# Patient Record
Sex: Female | Born: 1970
Health system: Southern US, Community
[De-identification: ages and names within clinical notes are randomized; demographics above are authoritative.]

## PROBLEM LIST (undated history)

## (undated) DIAGNOSIS — F32A Depression, unspecified: Secondary | ICD-10-CM

## (undated) DIAGNOSIS — F419 Anxiety disorder, unspecified: Secondary | ICD-10-CM

## (undated) DIAGNOSIS — K567 Ileus, unspecified: Secondary | ICD-10-CM

## (undated) DIAGNOSIS — R51 Headache: Secondary | ICD-10-CM

## (undated) DIAGNOSIS — F329 Major depressive disorder, single episode, unspecified: Secondary | ICD-10-CM

## (undated) DIAGNOSIS — C801 Malignant (primary) neoplasm, unspecified: Secondary | ICD-10-CM

## (undated) DIAGNOSIS — T8859XA Other complications of anesthesia, initial encounter: Secondary | ICD-10-CM

## (undated) DIAGNOSIS — Z9889 Other specified postprocedural states: Secondary | ICD-10-CM

## (undated) DIAGNOSIS — M1990 Unspecified osteoarthritis, unspecified site: Secondary | ICD-10-CM

## (undated) DIAGNOSIS — R519 Headache, unspecified: Secondary | ICD-10-CM

## (undated) DIAGNOSIS — T7840XA Allergy, unspecified, initial encounter: Secondary | ICD-10-CM

## (undated) DIAGNOSIS — R112 Nausea with vomiting, unspecified: Secondary | ICD-10-CM

## (undated) HISTORY — PX: ENDOMETRIAL ABLATION: SHX621

## (undated) HISTORY — DX: Headache, unspecified: R51.9

## (undated) HISTORY — DX: Depression, unspecified: F32.A

## (undated) HISTORY — DX: Anxiety disorder, unspecified: F41.9

## (undated) HISTORY — PX: WISDOM TOOTH EXTRACTION: SHX21

## (undated) HISTORY — PX: FOOT SURGERY: SHX648

## (undated) HISTORY — DX: Allergy, unspecified, initial encounter: T78.40XA

## (undated) HISTORY — DX: Major depressive disorder, single episode, unspecified: F32.9

## (undated) HISTORY — DX: Headache: R51

---

## 2005-07-25 ENCOUNTER — Ambulatory Visit: Payer: Self-pay | Admitting: Podiatry

## 2008-07-29 ENCOUNTER — Ambulatory Visit: Payer: Self-pay | Admitting: Obstetrics and Gynecology

## 2011-10-02 HISTORY — PX: TUBAL LIGATION: SHX77

## 2012-12-03 ENCOUNTER — Ambulatory Visit: Payer: Self-pay | Admitting: Obstetrics and Gynecology

## 2012-12-11 ENCOUNTER — Ambulatory Visit: Payer: Self-pay | Admitting: Obstetrics and Gynecology

## 2012-12-11 LAB — URINALYSIS, COMPLETE
Bilirubin,UR: NEGATIVE
Glucose,UR: NEGATIVE mg/dL (ref 0–75)
Leukocyte Esterase: NEGATIVE
Nitrite: NEGATIVE
Ph: 5 (ref 4.5–8.0)
Protein: NEGATIVE
Specific Gravity: 1.024 (ref 1.003–1.030)
Squamous Epithelial: 3
WBC UR: 1 /HPF (ref 0–5)

## 2012-12-11 LAB — HEMOGLOBIN: HGB: 15.1 g/dL (ref 12.0–16.0)

## 2012-12-19 ENCOUNTER — Ambulatory Visit: Payer: Self-pay | Admitting: Obstetrics and Gynecology

## 2013-08-03 LAB — HM PAP SMEAR: HM Pap smear: NORMAL

## 2014-03-17 ENCOUNTER — Ambulatory Visit: Payer: Self-pay | Admitting: Obstetrics and Gynecology

## 2015-01-14 ENCOUNTER — Other Ambulatory Visit: Payer: Self-pay | Admitting: Obstetrics and Gynecology

## 2015-01-14 DIAGNOSIS — Z1231 Encounter for screening mammogram for malignant neoplasm of breast: Secondary | ICD-10-CM

## 2015-01-21 NOTE — Op Note (Signed)
PATIENT NAME:  Tracey [name], BRIONES MR#:  [id number] DATE OF BIRTH:  21-Jan-1971  DATE OF PROCEDURE:  12/19/2012  PREOPERATIVE DIAGNOSES:  1.  Desires permanent sterilization. 2.  Heavy prolonged cycles.   POSTOPERATIVE DIAGNOSES:  1.  Desires permanent sterilization. 2.  Heavy prolonged cycles.   PROCEDURES PERFORMED:   1. NovaSure endometrial ablation with hysteroscopy. 2. Laparoscopic tubal cautery.   SURGEON: Ricky L. Amalia Hailey, MD  ANESTHESIA: General endotracheal.   FINDINGS: Grossly normal abdomen, uterus, tubes and ovaries. Normal endometrial cavity.  Cavity length was 5 cm, cavity width 3.7 cm, power throughout cycle was 102, and the duration of the cycle was 1 minute and 30 seconds.   ESTIMATED BLOOD LOSS:  Minimal.   COMPLICATIONS: None.  DRAINS:  In and out cath with a red rubber catheter prior to laparoscopy of approximately 100 mL.   PROCEDURE IN DETAIL: The patient was consented. Preoperative antibiotics given. Taken to the operating room and placed in the supine position where anesthesia was initiated, placed in the dorsal lithotomy position using candy-cane stirrups, prepped and draped in the usual sterile fashion. The cervix was visualized and grasped with a single tooth tenaculum. Easily dilated up to a 17-French with hysteroscopic findings showing unremarkable cavity.   The hysteroscope was removed and NovaSure device was placed with measurements as noted above. Cavity test passed on the first attempt and cycle was carried out over 30 seconds without difficulty. This instrument was removed. A single-tooth tenaculum was replaced with a Hulka tenaculum and we turned our attention to the abdomen.   An 11 port was placed infraumbilically, after a 1 cm vertical infraumbilical incision was made, with a 15 blade.   Pneumoperitoneum was established with findings as noted above. The patient was placed in Trendelenburg position and proceeded with cautery over an approximately  2.5 cm portion, in the mid ampullary region of bilateral fallopian tubes. Pressure was lowered to 6 mmHg. Areas were seen to be hemostatic. Pneumoperitoneum was allowed to resolve. Incisions were closed with deep of zero, sub-Q with 3-0 Vicryl, and Band-Aid was placed.  The patient tolerated the procedure well. I anticipate a routine postoperative course.  ____________________________ Rockey Situ. Amalia Hailey, MD rle:sb D: 12/19/2012 09:34:43 ET T: 12/19/2012 09:49:47 ET JOB#: [id number]  cc: Audry Pili L. Amalia Hailey, MD, <Dictator> Selmer Dominion MD ELECTRONICALLY SIGNED 12/20/2012 13:28

## 2015-03-16 LAB — HM MAMMOGRAPHY: HM Mammogram: NEGATIVE

## 2015-03-23 ENCOUNTER — Ambulatory Visit
Admission: RE | Admit: 2015-03-23 | Discharge: 2015-03-23 | Disposition: A | Discharge status: Home / Self Care | Payer: BLUE CROSS/BLUE SHIELD | Source: Ambulatory Visit | Attending: Obstetrics and Gynecology | Admitting: Obstetrics and Gynecology

## 2015-03-23 DIAGNOSIS — Z1231 Encounter for screening mammogram for malignant neoplasm of breast: Secondary | ICD-10-CM | POA: Diagnosis present

## 2015-03-23 HISTORY — DX: Malignant (primary) neoplasm, unspecified: C80.1

## 2015-08-16 ENCOUNTER — Encounter: Payer: Self-pay | Admitting: Nurse Practitioner

## 2015-08-16 ENCOUNTER — Ambulatory Visit (INDEPENDENT_AMBULATORY_CARE_PROVIDER_SITE_OTHER): Payer: BLUE CROSS/BLUE SHIELD | Admitting: Nurse Practitioner

## 2015-08-16 VITALS — BP 136/84 | HR 85 | Temp 97.8°F | Resp 12 | Ht 62.0 in | Wt 130.6 lb

## 2015-08-16 DIAGNOSIS — F5105 Insomnia due to other mental disorder: Secondary | ICD-10-CM

## 2015-08-16 DIAGNOSIS — R51 Headache: Secondary | ICD-10-CM | POA: Diagnosis not present

## 2015-08-16 DIAGNOSIS — Z9889 Other specified postprocedural states: Secondary | ICD-10-CM

## 2015-08-16 DIAGNOSIS — F341 Dysthymic disorder: Secondary | ICD-10-CM

## 2015-08-16 DIAGNOSIS — F418 Other specified anxiety disorders: Secondary | ICD-10-CM

## 2015-08-16 DIAGNOSIS — R519 Headache, unspecified: Secondary | ICD-10-CM

## 2015-08-16 DIAGNOSIS — Z7189 Other specified counseling: Secondary | ICD-10-CM

## 2015-08-16 DIAGNOSIS — Z7689 Persons encountering health services in other specified circumstances: Secondary | ICD-10-CM

## 2015-08-16 MED ORDER — ZOLPIDEM TARTRATE ER 12.5 MG PO TBCR: 12.5000 mg | EXTENDED_RELEASE_TABLET | Freq: Every evening | ORAL | Status: DC | PRN

## 2015-08-16 MED ORDER — BUSPIRONE HCL 10 MG PO TABS: 10.0000 mg | ORAL_TABLET | Freq: Two times a day (BID) | ORAL | Status: DC

## 2015-08-16 NOTE — Patient Instructions (Signed)
  Try the buspirone 1 tablet at night then after 7 days take 1 in the morning also.    Generalized Anxiety Disorder Generalized anxiety disorder (GAD) is a mental disorder. It interferes with life functions, including relationships, work, and school. GAD is different from normal anxiety, which everyone experiences at some point in their lives in response to specific life events and activities. Normal anxiety actually helps [place] prepare for and get through these life events and activities. Normal anxiety goes away after the event or activity is over.  GAD causes anxiety that is not necessarily related to specific events or activities. It also causes excess anxiety in proportion to specific events or activities. The anxiety associated with GAD is also difficult to control. GAD can vary from mild to severe. People with severe GAD can have intense waves of anxiety with physical symptoms (panic attacks).  SYMPTOMS The anxiety and worry associated with GAD are difficult to control. This anxiety and worry are related to many life events and activities and also occur more days than not for 6 months or longer. People with GAD also have three or more of the following symptoms (one or more in children):  Restlessness.   Fatigue.  Difficulty concentrating.   Irritability.  Muscle tension.  Difficulty sleeping or unsatisfying sleep. DIAGNOSIS GAD is diagnosed through an assessment by your health care provider. Your health care provider will ask you questions aboutyour mood,physical symptoms, and events in your life. Your health care provider may ask you about your medical history and use of alcohol or drugs, including prescription medicines. Your health care provider may also do a physical exam and blood tests. Certain medical conditions and the use of certain substances can cause symptoms similar to those associated with GAD. Your health care provider may refer you to a mental health specialist for  further evaluation. TREATMENT The following therapies are usually used to treat GAD:   Medication. Antidepressant medication usually is prescribed for long-term daily control. Antianxiety medicines may be added in severe cases, especially when panic attacks occur.   Talk therapy (psychotherapy). Certain types of talk therapy can be helpful in treating GAD by providing support, education, and guidance. A form of talk therapy called cognitive behavioral therapy can teach you healthy ways to think about and react to daily life events and activities.  Stress managementtechniques. These include yoga, meditation, and exercise and can be very helpful when they are practiced regularly. A mental health specialist can help determine which treatment is best for you. Some people see improvement with one therapy. However, other people require a combination of therapies.   This information is not intended to replace advice given to you by your health care provider. Make sure you discuss any questions you have with your health care provider.   Document Released: 01/12/2013 Document Revised: 10/08/2014 Document Reviewed: 01/12/2013 Elsevier Interactive Patient Education [employer].

## 2015-08-16 NOTE — Progress Notes (Signed)
Patient ID: TAQUILLA ECHEVERRY, female    DOB: 1971/01/24  Age: 44 y.o. MRN: [id number]  CC: Establish Care   HPI Tracey [name] presents for establishing care and CC of anxiety, insomnia issues.   1) New pt info:  Immunizations- Need records  Mammogram- 03/2015 normal per pt  Pap- 2014 normal per pt  Eye Exam- 2016  LMP- ablation  2) Chronic Problems-  Frequent headaches- worse prior to ablation 1-2 a month, using OTCs.  3) Acute Problems-  Anxiety- Pt referred by Dr. Nicolasa [name] by her CNM    Patient feels very anxious in social situations and everyday situations such as visiting the grocery store. She reports this has worsened over the past few months since she has not been sleeping as well.    Insomnia- Pt reports being on Ambien "for years", been out for a few months   Intermittent 12.5 mg use   Holy Tracey [name]- is trying currently   History Tracey [name] has a past medical history of Cancer ([place]) (skin); Frequent headaches; and Allergy.   She has past surgical history that includes Tubal ligation (2013).   Her family history includes Alcohol abuse in her brother; Breast cancer in her paternal grandmother; Diabetes in her paternal grandmother.She reports that she has never smoked. She has never used smokeless tobacco. She reports that she drinks about 1.2 oz of alcohol per week. She reports that she does not use illicit drugs.  No outpatient prescriptions prior to visit.   No facility-administered medications prior to visit.    ROS Review of Systems  Constitutional: Negative for fever, chills, diaphoresis and fatigue.  Respiratory: Negative for chest tightness, shortness of breath and wheezing.   Cardiovascular: Negative for chest pain, palpitations and leg swelling.  Gastrointestinal: Negative for nausea, vomiting and diarrhea.  Skin: Negative for rash.  Neurological: Positive for headaches. Negative for dizziness, weakness and numbness.  Psychiatric/Behavioral: Positive for  sleep disturbance. Negative for suicidal ideas. The patient is nervous/anxious.     Objective:  BP 136/84 mmHg  Pulse 85  Temp(Src) 97.8 F (36.6 C)  Resp 12  Ht 5\' 2"  (1.575 m)  Wt 130 lb 9.6 oz (59.24 kg)  BMI 23.88 kg/m2  SpO2 98%  Physical Exam  Constitutional: She is oriented to person, place, and time. She appears well-developed and well-nourished. No distress.  HENT:  Head: Normocephalic and atraumatic.  Right Ear: External ear normal.  Left Ear: External ear normal.  Eyes: Right eye exhibits no discharge. Left eye exhibits no discharge. No scleral icterus.  Cardiovascular: Normal rate, regular rhythm and normal heart sounds.  Exam reveals no gallop and no friction rub.   No murmur heard. Pulmonary/Chest: Effort normal and breath sounds normal. No respiratory distress. She has no wheezes. She has no rales. She exhibits no tenderness.  Neurological: She is alert and oriented to person, place, and time. No cranial nerve deficit. She exhibits normal muscle tone. Coordination normal.  Skin: Skin is warm and dry. No rash noted. She is not diaphoretic.  Psychiatric: She has a normal mood and affect. Her behavior is normal. Judgment and thought content normal.  Pt appears anxious when discussing her concerns today      Assessment & Plan:   Tracey [name] was seen today for establish care.  Diagnoses and all orders for this visit:  Encounter to establish care  Frequent headaches  History of endometrial ablation  Insomnia secondary to depression with anxiety  Other orders -     zolpidem (AMBIEN  CR) 12.5 MG CR tablet; Take 1 tablet (12.5 mg total) by mouth at bedtime as needed for sleep. -     busPIRone (BUSPAR) 10 MG tablet; Take 1 tablet (10 mg total) by mouth 2 (two) times daily.  I am having Tracey [name] start on zolpidem and busPIRone. I am also having her maintain her montelukast, levocetirizine, and hydrOXYzine.  Meds ordered this encounter  Medications  .  montelukast (SINGULAIR) 10 MG tablet    Sig: Take 1 tablet by mouth daily.    Refill:  0  . levocetirizine (XYZAL) 5 MG tablet    Sig: Take 1 tablet by mouth daily.    Refill:  0  . hydrOXYzine (ATARAX/VISTARIL) 25 MG tablet    Sig: Take 1 tablet by mouth daily.    Refill:  0  . zolpidem (AMBIEN CR) 12.5 MG CR tablet    Sig: Take 1 tablet (12.5 mg total) by mouth at bedtime as needed for sleep.    Dispense:  30 tablet    Refill:  2    Order Specific Question:  Supervising Provider    Answer:  Tracey [name] [[id number]]  . busPIRone (BUSPAR) 10 MG tablet    Sig: Take 1 tablet (10 mg total) by mouth 2 (two) times daily.    Dispense:  60 tablet    Refill:  0    Order Specific Question:  Supervising Provider    Answer:  Tracey [name] [[id number]]     Follow-up: Return in about 4 weeks (around 09/13/2015) for Anxiety follow up .

## 2015-08-16 NOTE — Progress Notes (Signed)
Pre visit review using our clinic review tool, if applicable. No additional management support is needed unless otherwise documented below in the visit note. 

## 2015-08-28 DIAGNOSIS — R51 Headache: Secondary | ICD-10-CM

## 2015-08-28 DIAGNOSIS — G4700 Insomnia, unspecified: Secondary | ICD-10-CM | POA: Insufficient documentation

## 2015-08-28 DIAGNOSIS — F5105 Insomnia due to other mental disorder: Secondary | ICD-10-CM

## 2015-08-28 DIAGNOSIS — F418 Other specified anxiety disorders: Secondary | ICD-10-CM | POA: Insufficient documentation

## 2015-08-28 DIAGNOSIS — Z7689 Persons encountering health services in other specified circumstances: Secondary | ICD-10-CM | POA: Insufficient documentation

## 2015-08-28 DIAGNOSIS — R519 Headache, unspecified: Secondary | ICD-10-CM | POA: Insufficient documentation

## 2015-08-28 DIAGNOSIS — Z9889 Other specified postprocedural states: Secondary | ICD-10-CM | POA: Insufficient documentation

## 2015-08-28 NOTE — Assessment & Plan Note (Signed)
Discussed acute and chronic issues. Reviewed health maintenance measures, PFSHx, and immunizations. Obtain records from previous facility.   

## 2015-08-28 NOTE — Assessment & Plan Note (Signed)
Patient has GYN care at a [facility] facility. Normal paps she reports and h/o ablation

## 2015-08-28 NOTE — Assessment & Plan Note (Addendum)
Will return to Ambien CR 12.5 mg. Pt was given CSC to sign. Will obtain UDS in future. [place] checked prior to prescribing. Buspar given for anxiety. Pt reports taking hydroxyzine for allergies and reports it does not cause fatigue for her. Pt declined counseling at this time. FU in 4 weeks.

## 2015-08-28 NOTE — Assessment & Plan Note (Signed)
Pt reports 1-2 HA a month currently. They have improved slightly. No known triggers. Continue OTC measures.

## 2015-09-13 ENCOUNTER — Ambulatory Visit: Payer: BLUE CROSS/BLUE SHIELD | Admitting: Nurse Practitioner

## 2015-11-14 ENCOUNTER — Telehealth: Payer: Self-pay | Admitting: *Deleted

## 2015-11-14 ENCOUNTER — Other Ambulatory Visit: Payer: Self-pay | Admitting: *Deleted

## 2015-11-14 MED ORDER — ZOLPIDEM TARTRATE ER 12.5 MG PO TBCR: 12.5000 mg | EXTENDED_RELEASE_TABLET | Freq: Every evening | ORAL | Status: DC | PRN

## 2015-11-14 NOTE — Telephone Encounter (Signed)
Rx faxed to pharmacy  

## 2015-11-14 NOTE — Telephone Encounter (Signed)
Pt requesting refill on Ambien, Last OV and refill 08/2015... Okay to refill?

## 2015-11-14 NOTE — Telephone Encounter (Signed)
Okay to fill for 1 month, but needs to be seen before another refill.

## 2015-12-19 ENCOUNTER — Ambulatory Visit (INDEPENDENT_AMBULATORY_CARE_PROVIDER_SITE_OTHER): Payer: BLUE CROSS/BLUE SHIELD | Admitting: Nurse Practitioner

## 2015-12-19 ENCOUNTER — Other Ambulatory Visit: Payer: Self-pay | Admitting: Obstetrics and Gynecology

## 2015-12-19 ENCOUNTER — Encounter: Payer: Self-pay | Admitting: Nurse Practitioner

## 2015-12-19 VITALS — BP 102/60 | HR 107 | Temp 98.4°F | Resp 14 | Ht 62.0 in | Wt 137.8 lb

## 2015-12-19 DIAGNOSIS — F341 Dysthymic disorder: Secondary | ICD-10-CM

## 2015-12-19 DIAGNOSIS — Z1231 Encounter for screening mammogram for malignant neoplasm of breast: Secondary | ICD-10-CM

## 2015-12-19 DIAGNOSIS — F5105 Insomnia due to other mental disorder: Secondary | ICD-10-CM

## 2015-12-19 DIAGNOSIS — F418 Other specified anxiety disorders: Secondary | ICD-10-CM

## 2015-12-19 MED ORDER — BUSPIRONE HCL 10 MG PO TABS: 10.0000 mg | ORAL_TABLET | Freq: Two times a day (BID) | ORAL | Status: DC

## 2015-12-19 NOTE — Progress Notes (Signed)
Patient ID: Tracey [name], female    DOB: 1970-11-04  Age: 45 y.o. MRN: [id number]  CC: Follow-up   HPI RENAYE MELLIES presents for follow up of ambien.   1) Eating at night- found a lot of wrappers in bed, but didn't remember anything happening  Groggy during the day with the Ambien   Wt Readings from Last 3 Encounters:  12/19/15 137 lb 12.8 oz (62.506 kg)  08/16/15 130 lb 9.6 oz (59.24 kg)   2) Anxiety is improved   History Nadalyn has a past medical history of Cancer ([place]) (skin); Frequent headaches; and Allergy.   She has past surgical history that includes Tubal ligation (2013).   Her family history includes Alcohol abuse in her brother; Breast cancer in her paternal grandmother; Diabetes in her paternal grandmother.She reports that she has never smoked. She has never used smokeless tobacco. She reports that she drinks about 1.2 oz of alcohol per week. She reports that she does not use illicit drugs.  Outpatient Prescriptions Prior to Visit  Medication Sig Dispense Refill  . hydrOXYzine (ATARAX/VISTARIL) 25 MG tablet Take 1 tablet by mouth daily.  0  . levocetirizine (XYZAL) 5 MG tablet Take 1 tablet by mouth daily.  0  . montelukast (SINGULAIR) 10 MG tablet Take 1 tablet by mouth daily.  0  . busPIRone (BUSPAR) 10 MG tablet Take 1 tablet (10 mg total) by mouth 2 (two) times daily. 60 tablet 0  . zolpidem (AMBIEN CR) 12.5 MG CR tablet Take 1 tablet (12.5 mg total) by mouth at bedtime as needed for sleep. 30 tablet 0   No facility-administered medications prior to visit.    ROS Review of Systems  Constitutional: Negative for fever, chills, diaphoresis and fatigue.  Respiratory: Negative for chest tightness, shortness of breath and wheezing.   Cardiovascular: Negative for chest pain, palpitations and leg swelling.  Gastrointestinal: Negative for nausea, vomiting and diarrhea.  Skin: Negative for rash.  Neurological: Negative for dizziness, weakness, numbness  and headaches.  Psychiatric/Behavioral: Positive for sleep disturbance. The patient is not nervous/anxious.     Objective:  BP 102/60 mmHg  Pulse 107  Temp(Src) 98.4 F (36.9 C) (Oral)  Resp 14  Ht 5\' 2"  (1.575 m)  Wt 137 lb 12.8 oz (62.506 kg)  BMI 25.20 kg/m2  SpO2 96%  Physical Exam  Constitutional: She is oriented to person, place, and time. She appears well-developed and well-nourished. No distress.  HENT:  Head: Normocephalic and atraumatic.  Right Ear: External ear normal.  Left Ear: External ear normal.  Cardiovascular: Regular rhythm.   Anxious tachycardic  Pulmonary/Chest: Effort normal and breath sounds normal.  Neurological: She is alert and oriented to person, place, and time.  Skin: Skin is warm and dry. She is not diaphoretic.  Psychiatric: She has a normal mood and affect. Her behavior is normal. Judgment and thought content normal.      Assessment & Plan:   Tamy was seen today for follow-up.  Diagnoses and all orders for this visit:  Insomnia secondary to depression with anxiety  Other orders -     busPIRone (BUSPAR) 10 MG tablet; Take 1 tablet (10 mg total) by mouth 2 (two) times daily.  I have discontinued [name]'s zolpidem. I am also having her maintain her montelukast, levocetirizine, hydrOXYzine, EPINEPHrine, PROAIR RESPICLICK, and busPIRone.  Meds ordered this encounter  Medications  . EPINEPHrine (EPIPEN 2-PAK) 0.3 mg/0.3 mL IJ SOAJ injection    Sig:   . PROAIR RESPICLICK  108 (90 Base) MCG/ACT AEPB    Sig:     Refill:  0  . busPIRone (BUSPAR) 10 MG tablet    Sig: Take 1 tablet (10 mg total) by mouth 2 (two) times daily.    Dispense:  180 tablet    Refill:  0    Order Specific Question:  Supervising Provider    Answer:  Crecencio Mc [[id number]]     Follow-up: Return in about 3 months (around 03/20/2016) for Follow up of medication .

## 2015-12-19 NOTE — Patient Instructions (Signed)
Try the Buspirone (Buspar) 1 full tablet at night and 1/2 tablet with breakfast (can try to take 1 tablet at night and 1/2 up to twice during the day)   Stop the Ambien

## 2015-12-25 NOTE — Assessment & Plan Note (Signed)
Stopping the [place]- asked her to wean, but hasn't taken it in a day or two Stable on buspirone FU prn

## 2016-01-17 ENCOUNTER — Telehealth: Payer: Self-pay | Admitting: Nurse Practitioner

## 2016-01-17 NOTE — Telephone Encounter (Signed)
The patient just wanted to let you know that she is doing very well on the busPIRone (BUSPAR) 10 MG tablet .

## 2016-01-18 NOTE — Telephone Encounter (Signed)
Awesome! Thanks

## 2016-03-16 DIAGNOSIS — M7711 Lateral epicondylitis, right elbow: Secondary | ICD-10-CM | POA: Diagnosis not present

## 2016-03-21 ENCOUNTER — Ambulatory Visit: Payer: BLUE CROSS/BLUE SHIELD | Admitting: Nurse Practitioner

## 2016-03-26 ENCOUNTER — Ambulatory Visit: Payer: BLUE CROSS/BLUE SHIELD

## 2016-03-27 ENCOUNTER — Other Ambulatory Visit: Payer: Self-pay | Admitting: Obstetrics and Gynecology

## 2016-03-27 ENCOUNTER — Ambulatory Visit
Admission: RE | Admit: 2016-03-27 | Discharge: 2016-03-27 | Disposition: A | Discharge status: Home / Self Care | Payer: BLUE CROSS/BLUE SHIELD | Source: Ambulatory Visit | Attending: Obstetrics and Gynecology | Admitting: Obstetrics and Gynecology

## 2016-03-27 DIAGNOSIS — Z1231 Encounter for screening mammogram for malignant neoplasm of breast: Secondary | ICD-10-CM | POA: Insufficient documentation

## 2016-04-26 DIAGNOSIS — R21 Rash and other nonspecific skin eruption: Secondary | ICD-10-CM | POA: Diagnosis not present

## 2016-04-26 DIAGNOSIS — R05 Cough: Secondary | ICD-10-CM | POA: Diagnosis not present

## 2016-04-26 DIAGNOSIS — J3089 Other allergic rhinitis: Secondary | ICD-10-CM | POA: Diagnosis not present

## 2016-04-26 DIAGNOSIS — J301 Allergic rhinitis due to pollen: Secondary | ICD-10-CM | POA: Diagnosis not present

## 2016-08-07 ENCOUNTER — Encounter: Payer: Self-pay | Admitting: Family

## 2016-08-07 ENCOUNTER — Ambulatory Visit (INDEPENDENT_AMBULATORY_CARE_PROVIDER_SITE_OTHER): Payer: BLUE CROSS/BLUE SHIELD | Admitting: Family

## 2016-08-07 VITALS — BP 120/78 | HR 100 | Temp 98.2°F | Ht 62.0 in | Wt 134.8 lb

## 2016-08-07 DIAGNOSIS — F5105 Insomnia due to other mental disorder: Secondary | ICD-10-CM | POA: Diagnosis not present

## 2016-08-07 DIAGNOSIS — F418 Other specified anxiety disorders: Secondary | ICD-10-CM

## 2016-08-07 MED ORDER — SERTRALINE HCL 50 MG PO TABS: 50.0000 mg | ORAL_TABLET | Freq: Every day | ORAL | 0 refills | Status: DC

## 2016-08-07 NOTE — Progress Notes (Signed)
Pre visit review using our clinic review tool, if applicable. No additional management support is needed unless otherwise documented below in the visit note. 
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
## 2016-08-07 NOTE — Patient Instructions (Signed)
Our hope is for gradual improvement of your anxiety since starting medication; however this may take several weeks.   If you start to have unusual thoughts, thoughts of hurting yourself, or anyone else, please go immediately to the emergency department.   Follow up in 6-8 weeks.  National Suicide Prevention Hotline - available 24 hours a day, 7 days a week.  [phone]

## 2016-08-07 NOTE — Assessment & Plan Note (Addendum)
Worsening. Patient took herself off of the Buspar as I was not working for her. She has tried Ambien in the past however had sleep walking side effects. We jointly decided with her generalized anxiety disorder, trialing an SSRI, Zoloft is most appropriate next step. We also discussed focusing on GAD at this time and will discuss concentration complaint once better control of GAD. F/u 6 weeks.

## 2016-08-15 ENCOUNTER — Telehealth: Payer: Self-pay | Admitting: *Deleted

## 2016-08-15 DIAGNOSIS — G4700 Insomnia, unspecified: Secondary | ICD-10-CM

## 2016-08-15 MED ORDER — HYDROXYZINE HCL 50 MG PO TABS: 50.0000 mg | ORAL_TABLET | Freq: Every evening | ORAL | 0 refills | Status: DC | PRN

## 2016-08-15 NOTE — Telephone Encounter (Signed)
Patient has been informed. Patient would also like something to help with her anxiety because her sx are worse, at least until the medication is fully in her system.  Patient feels she is overwhelmed with her life at the moment and being pulled in too many directions. Please advise.

## 2016-08-15 NOTE — Telephone Encounter (Signed)
Please advise 

## 2016-08-15 NOTE — Telephone Encounter (Signed)
Please call patient-  Please offer patient reassurance. Unfortunately Zoloft take 6-8 weeks to really see effects. Also started her on a low dose.  May increase Zoloft from 50mg  at bedtime to 100mg  total at bedtime. Please ensure she has f/u with me in 6-8 weeks from last OV.

## 2016-08-15 NOTE — Telephone Encounter (Signed)
Spoke with patient; we agreed to start Atarax as adjunct. No manic symptoms.

## 2016-08-15 NOTE — Telephone Encounter (Signed)
Patient stated she was prescribed Zoloft, however she still cant sleep. She requested a medication to help her sleep.  Pt contact [phone]

## 2016-09-07 ENCOUNTER — Telehealth: Payer: Self-pay | Admitting: *Deleted

## 2016-09-07 NOTE — Telephone Encounter (Signed)
Patient stated that she really loves the medication. But since patient has been taking medication patient has been feeling very tired through out the day and also is constantly yawning. Please advise.

## 2016-09-07 NOTE — Telephone Encounter (Signed)
Patient requested a call in reference to the Zoloft medication she's taking  Pt contact [phone]

## 2016-09-09 NOTE — Telephone Encounter (Signed)
Advise taking the zoloft earlier than bedtime- maybe 2-3 hours before bedtime   This symptom usually gets better.  I would like to see how she does after 6-8 weeks since that's when we really see therapeutic effect.   Is she able to hang in there and see how we do?

## 2016-09-10 ENCOUNTER — Telehealth: Payer: Self-pay | Admitting: Nurse Practitioner

## 2016-09-10 NOTE — Telephone Encounter (Signed)
Pt called back returning your call. Thank you!  Call pt @ [phone]

## 2016-09-10 NOTE — Telephone Encounter (Signed)
Patient has been informed.

## 2016-09-20 ENCOUNTER — Ambulatory Visit (INDEPENDENT_AMBULATORY_CARE_PROVIDER_SITE_OTHER): Payer: BLUE CROSS/BLUE SHIELD | Admitting: Family

## 2016-09-20 ENCOUNTER — Encounter: Payer: Self-pay | Admitting: Family

## 2016-09-20 DIAGNOSIS — F418 Other specified anxiety disorders: Secondary | ICD-10-CM

## 2016-09-20 DIAGNOSIS — F5105 Insomnia due to other mental disorder: Secondary | ICD-10-CM

## 2016-09-20 MED ORDER — BUPROPION HCL ER (XL) 150 MG PO TB24: 150.0000 mg | ORAL_TABLET | Freq: Every day | ORAL | 0 refills | Status: DC

## 2016-09-20 MED ORDER — SERTRALINE HCL 100 MG PO TABS: 100.0000 mg | ORAL_TABLET | Freq: Every day | ORAL | 1 refills | Status: DC

## 2016-09-20 NOTE — Progress Notes (Signed)
Pre visit review using our clinic review tool, if applicable. No additional management support is needed unless otherwise documented below in the visit note. 

## 2016-09-20 NOTE — Assessment & Plan Note (Addendum)
Pleased to see her that anxiety is much improved. Adjunct Zoloft with Wellbutrin to increase energy during the day. If continues to gain weight, we will consider trying a different SSRI. Follow-up by phone or in person in couple weeks.

## 2016-09-20 NOTE — Progress Notes (Signed)
Subjective:    Patient ID: Tracey [name], female    DOB: 1971/05/20, 45 y.o.   MRN: [id number]  CC: Tracey [name] is a 45 y.o. female who presents today for follow up.   HPI: Depression, anxiety, insomnia- started zoloft at last visit. Anxiety well controlled and friends, family have noticed.   Sleeping well. Tired throughout [name] and doesn't have 'same get up and go'. Has gained 6 pounds. Hasn't been to the gym in 3 weeks.          HISTORY:  Past Medical History:  Diagnosis Date  . Allergy    Seasonal  . Cancer ([place]) skin  . Frequent headaches    Past Surgical History:  Procedure Laterality Date  . TUBAL LIGATION  2013   Family History  Problem Relation Age of Onset  . Breast cancer Paternal Grandmother   . Diabetes Paternal Grandmother   . Alcohol abuse Brother     Allergies: Patient has no known allergies. Current Outpatient Prescriptions on File Prior to Visit  Medication Sig Dispense Refill  . EPINEPHrine (EPIPEN 2-PAK) 0.3 mg/0.3 mL IJ SOAJ injection     . levocetirizine (XYZAL) 5 MG tablet Take 1 tablet by mouth daily.  0  . montelukast (SINGULAIR) 10 MG tablet Take 1 tablet by mouth daily.  0  . PROAIR RESPICLICK 123XX123 (90 Base) MCG/ACT AEPB   0   No current facility-administered medications on file prior to visit.     Social History  Substance Use Topics  . Smoking status: Never Smoker  . Smokeless tobacco: Never Used  . Alcohol use 1.2 oz/week    2 Glasses of wine per week    Review of Systems  Constitutional: Negative for chills and fever.  Respiratory: Negative for cough.   Cardiovascular: Negative for chest pain and palpitations.  Gastrointestinal: Negative for nausea and vomiting.  Psychiatric/Behavioral: Negative for sleep disturbance. The patient is not nervous/anxious.       Objective:    BP 128/66   Pulse 84   Temp 98 F (36.7 C) (Oral)   Ht 5\' 2"  (1.575 m)   Wt 141 lb 12.8 oz (64.3 kg)   SpO2 98%   BMI 25.94 kg/m   BP Readings from Last 3 Encounters:  09/20/16 128/66  08/07/16 120/78  12/19/15 102/60   Wt Readings from Last 3 Encounters:  09/20/16 141 lb 12.8 oz (64.3 kg)  08/07/16 134 lb 12.8 oz (61.1 kg)  12/19/15 137 lb 12.8 oz (62.5 kg)    Physical Exam  Constitutional: She appears well-developed and well-nourished.  Eyes: Conjunctivae are normal.  Cardiovascular: Normal rate, regular rhythm, normal heart sounds and normal pulses.   Pulmonary/Chest: Effort normal and breath sounds normal. She has no wheezes. She has no rhonchi. She has no rales.  Neurological: She is alert.  Skin: Skin is warm and dry.  Psychiatric: She has a normal mood and affect. Her speech is normal and behavior is normal. Thought content normal.  Vitals reviewed.      Assessment & Plan:   Problem List Items Addressed This Visit      Other   Insomnia secondary to depression with anxiety    Pleased to see her that anxiety is much improved. Adjunct Zoloft with Wellbutrin to increase energy during the [name]. If continues to gain weight, we will consider trying a different SSRI. Follow-up by phone or in person in couple weeks.      Relevant Medications   sertraline (ZOLOFT)  100 MG tablet   buPROPion (WELLBUTRIN XL) 150 MG 24 hr tablet       I have discontinued [name]'s hydrOXYzine. I have also changed her sertraline. Additionally, I am having her start on buPROPion. Lastly, I am having her maintain her montelukast, levocetirizine, EPINEPHrine, and PROAIR RESPICLICK.   Meds ordered this encounter  Medications  . sertraline (ZOLOFT) 100 MG tablet    Sig: Take 1 tablet (100 mg total) by mouth at bedtime.    Dispense:  90 tablet    Refill:  1    Order Specific Question:   Supervising Provider    Answer:   Deborra Medina L [[id number]]  . buPROPion (WELLBUTRIN XL) 150 MG 24 hr tablet    Sig: Take 1 tablet (150 mg total) by mouth daily. Take one tablet by mouth every morning for 7 days, and then increase to two  tablets by mouth every morning.    Dispense:  90 tablet    Refill:  0    Order Specific Question:   Supervising Provider    Answer:   Crecencio Mc [[id number]]    Return precautions given.   Risks, benefits, and alternatives of the medications and treatment plan prescribed today were discussed, and patient expressed understanding.   Education regarding symptom management and diagnosis given to patient on AVS.  Continue to follow with Rubbie Battiest, NP for routine health maintenance.   [place] and I agreed with plan.   Mable Paris, FNP

## 2016-09-20 NOTE — Patient Instructions (Signed)
Trial wellbutrin.  Let me know if you don't feel energy lift

## 2016-10-23 ENCOUNTER — Telehealth: Payer: Self-pay | Admitting: *Deleted

## 2016-10-23 NOTE — Telephone Encounter (Signed)
Pt requested a call to discuss questions about  her Rx's for Zoloft and wellbutrin  Pt contact [phone]

## 2016-10-24 NOTE — Telephone Encounter (Signed)
Patient would like to know if there is anything else that she can receive to cut down the weight gaining and the appetite cravings since taking zoloft. Patient stated the medication combo between both is working very well.  Patient stated that she is taking three pills in the morning and was wondering if that is safe to do so.  Also since patient is gaining weight due to medication,  She may lose her benefits due to gaining weight, health insurance.

## 2016-10-29 NOTE — Telephone Encounter (Signed)
Awaiting response

## 2016-10-29 NOTE — Telephone Encounter (Signed)
Pt called back looking for a status update. Please advise, thank you!  Call pt @ [phone]

## 2016-10-29 NOTE — Telephone Encounter (Signed)
Spoke with pt  taking wellbutrin 450 ( prescribed 300 mg) .and is feeling help with focus. zoloft 100 however worried about weight gain. Not excercising  Doesn't want to come off the zoloft feeling so good. Advised to come in for OV and  Have CPE labs ( including b12) at work.   Reassurance provided and pt will let me know if she changes her mind.

## 2016-11-19 ENCOUNTER — Other Ambulatory Visit: Payer: Self-pay | Admitting: Family

## 2016-11-19 DIAGNOSIS — F5105 Insomnia due to other mental disorder: Principal | ICD-10-CM

## 2016-11-19 DIAGNOSIS — F418 Other specified anxiety disorders: Secondary | ICD-10-CM

## 2016-11-19 NOTE — Telephone Encounter (Signed)
[facility] called about needing a refill for pt. Pt will pick up Rx today.   Pharmacy is [place], [place].

## 2017-01-23 ENCOUNTER — Other Ambulatory Visit: Payer: Self-pay | Admitting: Family

## 2017-01-23 DIAGNOSIS — G4700 Insomnia, unspecified: Secondary | ICD-10-CM

## 2017-02-12 ENCOUNTER — Telehealth: Payer: Self-pay | Admitting: *Deleted

## 2017-02-12 NOTE — Telephone Encounter (Signed)
Patient requested a call to discuss medications  Pt contact [phone]

## 2017-02-12 NOTE — Telephone Encounter (Signed)
Spoke to patient. She stated that she is tapering off the zoloft and wellbutrin due to weight gain.  Also patient is making appointments to schedule for referral or be checked for ADHD.

## 2017-02-14 ENCOUNTER — Encounter: Payer: Self-pay | Admitting: Family

## 2017-02-14 ENCOUNTER — Ambulatory Visit (INDEPENDENT_AMBULATORY_CARE_PROVIDER_SITE_OTHER): Payer: BLUE CROSS/BLUE SHIELD | Admitting: Family

## 2017-02-14 VITALS — BP 126/78 | HR 88 | Temp 98.6°F | Ht 62.0 in | Wt 138.4 lb

## 2017-02-14 DIAGNOSIS — F5105 Insomnia due to other mental disorder: Secondary | ICD-10-CM | POA: Diagnosis not present

## 2017-02-14 DIAGNOSIS — F418 Other specified anxiety disorders: Secondary | ICD-10-CM | POA: Diagnosis not present

## 2017-02-14 DIAGNOSIS — R41840 Attention and concentration deficit: Secondary | ICD-10-CM

## 2017-02-14 NOTE — Assessment & Plan Note (Addendum)
Stable. Titrating off both zoloft and wellbutrin. Will follow

## 2017-02-14 NOTE — Progress Notes (Signed)
Subjective:    Patient ID: Tracey [name], female    DOB: 01/21/1971, 46 y.o.   MRN: [id number]  CC: Tracey [name] is a 46 y.o. female who presents today for follow up.   HPI: Anxiety- weight gain from zoloft so 'weaning off. ' wellbutrin hasn;t helped much. No depression.   Concerned for ADHD. Son has ADHD. Colleagues at work keep reminding her that she cannot complete task. Has noticed she can only focus one thing at a time. Easily distracted at work. Told she 'zones out all the time.' thinks lack of concentration contributes to anxiety.    Follows with Jaye Beagle for   HISTORY:  Past Medical History:  Diagnosis Date  . Allergy    Seasonal  . Cancer ([place]) skin  . Frequent headaches    Past Surgical History:  Procedure Laterality Date  . TUBAL LIGATION  2013   Family History  Problem Relation Age of Onset  . Breast cancer Paternal Grandmother   . Diabetes Paternal Grandmother   . Alcohol abuse Brother     Allergies: Patient has no known allergies. Current Outpatient Prescriptions on File Prior to Visit  Medication Sig Dispense Refill  . buPROPion (WELLBUTRIN XL) 150 MG 24 hr tablet take 1 tablet by mouth every morning for 7 days - then 2 tablets every morning 90 tablet 0  . EPINEPHrine (EPIPEN 2-PAK) 0.3 mg/0.3 mL IJ SOAJ injection     . levocetirizine (XYZAL) 5 MG tablet Take 1 tablet by mouth daily.  0  . montelukast (SINGULAIR) 10 MG tablet Take 1 tablet by mouth daily.  0  . PROAIR RESPICLICK 664 (90 Base) MCG/ACT AEPB   0  . sertraline (ZOLOFT) 100 MG tablet Take 1 tablet (100 mg total) by mouth at bedtime. 90 tablet 1   No current facility-administered medications on file prior to visit.     Social History  Substance Use Topics  . Smoking status: Never Smoker  . Smokeless tobacco: Never Used  . Alcohol use 1.2 oz/week    2 Glasses of wine per week    Review of Systems  Constitutional: Negative for chills and fever.  Respiratory:  Negative for cough.   Cardiovascular: Negative for chest pain and palpitations.  Gastrointestinal: Negative for nausea and vomiting.  Psychiatric/Behavioral: Positive for decreased concentration. The patient is nervous/anxious.       Objective:    BP 126/78   Pulse 88   Temp 98.6 F (37 C) (Oral)   Ht 5\' 2"  (1.575 m)   Wt 138 lb 6.4 oz (62.8 kg)   SpO2 99%   BMI 25.31 kg/m  BP Readings from Last 3 Encounters:  02/14/17 126/78  09/20/16 128/66  08/07/16 120/78   Wt Readings from Last 3 Encounters:  02/14/17 138 lb 6.4 oz (62.8 kg)  09/20/16 141 lb 12.8 oz (64.3 kg)  08/07/16 134 lb 12.8 oz (61.1 kg)    Physical Exam  Constitutional: She appears well-developed and well-nourished.  Eyes: Conjunctivae are normal.  Cardiovascular: Normal rate, regular rhythm, normal heart sounds and normal pulses.   Pulmonary/Chest: Effort normal and breath sounds normal. She has no wheezes. She has no rhonchi. She has no rales.  Neurological: She is alert.  Skin: Skin is warm and dry.  Psychiatric: She has a normal mood and affect. Her speech is normal and behavior is normal. Thought content normal.  Vitals reviewed.      Assessment & Plan:   Problem List Items Addressed This  Visit      Other   Insomnia secondary to depression with anxiety    Stable. Titrating off both zoloft and wellbutrin. Will follow      Concentration deficit - Primary    Referral for testing and potential treatment of ADHD.       Relevant Orders   Ambulatory referral to Psychiatry       I am having Ms. Pozzi maintain her montelukast, levocetirizine, EPINEPHrine, PROAIR RESPICLICK, sertraline, and buPROPion.   No orders of the defined types were placed in this encounter.   Return precautions given.   Risks, benefits, and alternatives of the medications and treatment plan prescribed today were discussed, and patient expressed understanding.   Education regarding symptom management and diagnosis  given to patient on AVS.  Continue to follow with Burnard Hawthorne, FNP for routine health maintenance.   [place] and I agreed with plan.   Mable Paris, FNP

## 2017-02-14 NOTE — Patient Instructions (Signed)
Referral placed  Let me know if you need anything from me  Titrate wellbutrin  150 mg every day for one week, then 150mg  every other day for one week. Then may stop as long as feeling okay

## 2017-02-14 NOTE — Progress Notes (Signed)
Pre visit review using our clinic review tool, if applicable. No additional management support is needed unless otherwise documented below in the visit note. 

## 2017-02-14 NOTE — Assessment & Plan Note (Signed)
Referral for testing and potential treatment of ADHD.

## 2017-02-21 ENCOUNTER — Encounter: Payer: Self-pay | Admitting: Psychiatry

## 2017-02-21 ENCOUNTER — Ambulatory Visit (INDEPENDENT_AMBULATORY_CARE_PROVIDER_SITE_OTHER): Payer: BLUE CROSS/BLUE SHIELD | Admitting: Psychiatry

## 2017-02-21 VITALS — BP 105/71 | HR 73 | Temp 97.9°F | Wt 136.4 lb

## 2017-02-21 DIAGNOSIS — F411 Generalized anxiety disorder: Secondary | ICD-10-CM

## 2017-02-21 DIAGNOSIS — F331 Major depressive disorder, recurrent, moderate: Secondary | ICD-10-CM | POA: Diagnosis not present

## 2017-02-21 MED ORDER — TRAZODONE HCL 50 MG PO TABS
50.0000 mg | ORAL_TABLET | Freq: Every day | ORAL | 1 refills | Status: DC
Start: 2017-02-21 — End: 2017-06-24

## 2017-02-21 MED ORDER — SERTRALINE HCL 25 MG PO TABS: 25.0000 mg | ORAL_TABLET | Freq: Every day | ORAL | 1 refills | Status: DC

## 2017-02-21 NOTE — Progress Notes (Signed)
Psychiatric Initial Adult Assessment   Patient Identification: Tracey [name] MRN:  [id number] Date of Evaluation:  02/21/2017 Referral Source: Mable Paris, FNP Chief Complaint:   Visit Diagnosis:    ICD-9-CM ICD-10-CM   1. MDD (major depressive disorder), recurrent episode, moderate (HCC) 296.32 F33.1   2. GAD (generalized anxiety disorder) 300.02 F41.1     History of Present Illness: Patient is a 46 year old Caucasian female who was referred by her primary care provider for an assessment of her mood and for ADHD. Patient today reports that she has been struggling with poor sleep for several years now and also anxiety. She has been worrying a lot and it has been impacting her job as well. Reports that she is a single mom and has a 38 year old son and an 62 year old son. Reports that doing well which is always worrying about making her payments on her house and taking care of other things. 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 Reports that she does have some mood swings. Reports that she is not enjoying her life as much as she used to and has low energy. She reports poor concentration and fidgetiness. States that she can't shut down at night and sleep well. States that she was on Ambien in the past but it was not helpful and she had side effects with sleepwalking. She denies any problems with concentration or focus during her school years. She completed high school and had to do some course work later since she got pregnant. Denies any problems with focusing and concentration until recently. However reports that her son has ADHD and is on medication. She was given Zoloft and Wellbutrin by her primary care provider and states that the Zoloft was somewhat helpful in decreasing her anxiety but she felt very tired on it. States that the Wellbutrin even at 300 mg was not helpful.  Patient denies problems with drugs or alcohol. She denies any psychotic  symptoms. She denies any abuse. However reports that her mom passed away at age 23 when she was age 72 from a car accident and her father died when she was 71 due to brain tumor. Her brother died 40 years ago from a drug overdose. States that she has had a lot of losses in her life.  PHQ 9 of 7  Associated Signs/Symptoms: Depression Symptoms:  anhedonia, insomnia, psychomotor agitation, fatigue, feelings of worthlessness/guilt, difficulty concentrating, hopelessness, anxiety, disturbed sleep, weight gain, (Hypo) Manic Symptoms:  Distractibility, Irritable Mood, Anxiety Symptoms:  Excessive Worry, Psychotic Symptoms:  denies PTSD Symptoms: NA  Past Psychiatric History: Patient has never seen a psychiatrist. She has never been admitted psychiatrically and has never seen a therapist.  Previous Psychotropic Medications: Yes   Substance Abuse History in the last 12 months:  No.  Consequences of Substance Abuse: Negative  Past Medical History:  Past Medical History:  Diagnosis Date  . Allergy    Seasonal  . Cancer ([place]) skin  . Frequent headaches     Past Surgical History:  Procedure Laterality Date  . TUBAL LIGATION  2013    Family Psychiatric History: Psychiatric illness on mom`s side of family. Brother was a drug addict and died 7 years from overdose.   Family History:  Family History  Problem Relation Age of Onset  . Breast cancer Paternal Grandmother   . Diabetes Paternal Grandmother   . Alcohol abuse Brother     Social History:   Social History  Social History  . Marital status: Married    Spouse name: N/A  . Number of children: N/A  . Years of education: N/A   Social History Main Topics  . Smoking status: Never Smoker  . Smokeless tobacco: Never Used  . Alcohol use 1.2 oz/week    2 Glasses of wine per week  . Drug use: No  . Sexual activity: Not Currently    Birth control/ protection: Other-see comments   Other Topics Concern  . Not on file    Social History Narrative   Caffeine- Tea rarely    Children- 2 boys    Additional Social History: Lives with her 43 yo son. 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 She is divorced.  Allergies:  No Known Allergies  Metabolic Disorder Labs: No results found for: HGBA1C, MPG No results found for: PROLACTIN No results found for: CHOL, TRIG, HDL, CHOLHDL, VLDL, LDLCALC   Current Medications: Current Outpatient Prescriptions  Medication Sig Dispense Refill  . EPINEPHrine (EPIPEN 2-PAK) 0.3 mg/0.3 mL IJ SOAJ injection     . levocetirizine (XYZAL) 5 MG tablet Take 1 tablet by mouth daily.  0  . montelukast (SINGULAIR) 10 MG tablet Take 1 tablet by mouth daily.  0  . PROAIR RESPICLICK 323 (90 Base) MCG/ACT AEPB   0  . sertraline (ZOLOFT) 25 MG tablet Take 1 tablet (25 mg total) by mouth daily. 30 tablet 1  . traZODone (DESYREL) 50 MG tablet Take 1 tablet (50 mg total) by mouth at bedtime. 30 tablet 1   No current facility-administered medications for this visit.     Neurologic: Headache: No Seizure: No Paresthesias:No  Musculoskeletal: Strength & Muscle Tone: within normal limits Gait & Station: normal Patient leans: N/A  Psychiatric Specialty Exam: ROS  There were no vitals taken for this visit.There is no height or weight on file to calculate BMI.  General Appearance: Casual  Eye Contact:  Fair  Speech:  Clear and Coherent  Volume:  Normal  Mood:  Anxious, Dysphoric and Irritable  Affect:  Appropriate  Thought Process:  Coherent  Orientation:  Full (Time, Place, and Person)  Thought Content:  Logical  Suicidal Thoughts:  No  Homicidal Thoughts:  No  Memory:  Immediate;   Fair Recent;   Fair Remote;   Fair  Judgement:  Fair  Insight:  Fair  Psychomotor Activity:  Normal  Concentration:  Concentration: Fair and Attention Span: Fair  Recall:  [employer] of Knowledge:Fair  Language: Fair  Akathisia:  No  Handed:  Right  AIMS (if indicated):  na   Assets:  Communication Skills Desire for Improvement Financial Resources/Insurance Housing Physical Health Resilience Social Support Vocational/Educational  ADL's:  Intact  Cognition: WNL  Sleep:  poor    Treatment Plan Summary: Major depressive disorder, moderate  Start zoloft at 25mg  poq d, discussed side effects and benefits. Start therapy with tina thompson, address her losses.  GAD Same as above  Insomnia Start trazodone at 50mg  at bedtime.  RTC in 2 weeks or call before if needed.   Elvin So, MD 5/24/20189:50 AM

## 2017-02-27 ENCOUNTER — Telehealth: Payer: Self-pay

## 2017-02-27 NOTE — Telephone Encounter (Signed)
per lea when she called pt to move pt appt. pt states that she doing alot better but that she is not sleeping.  she is waking up.  pt is taking trazodone 50mg . please advise.

## 2017-02-27 NOTE — Telephone Encounter (Signed)
She can take the trazodone up to 100 mg and see how it helps

## 2017-03-04 ENCOUNTER — Ambulatory Visit: Payer: BLUE CROSS/BLUE SHIELD | Admitting: Psychiatry

## 2017-03-13 ENCOUNTER — Other Ambulatory Visit: Payer: Self-pay | Admitting: Obstetrics and Gynecology

## 2017-03-13 DIAGNOSIS — Z01419 Encounter for gynecological examination (general) (routine) without abnormal findings: Secondary | ICD-10-CM | POA: Diagnosis not present

## 2017-03-13 DIAGNOSIS — Z1231 Encounter for screening mammogram for malignant neoplasm of breast: Secondary | ICD-10-CM

## 2017-03-13 DIAGNOSIS — Z1151 Encounter for screening for human papillomavirus (HPV): Secondary | ICD-10-CM | POA: Diagnosis not present

## 2017-03-13 DIAGNOSIS — Z124 Encounter for screening for malignant neoplasm of cervix: Secondary | ICD-10-CM | POA: Diagnosis not present

## 2017-03-18 ENCOUNTER — Ambulatory Visit: Payer: BLUE CROSS/BLUE SHIELD | Admitting: Psychiatry

## 2017-03-21 NOTE — Telephone Encounter (Signed)
pt was left a message that per dr Einar Grad it is ok to do trazodone 100mg  and see how it does.

## 2017-04-09 ENCOUNTER — Ambulatory Visit
Admission: RE | Admit: 2017-04-09 | Discharge: 2017-04-09 | Disposition: A | Discharge status: Home / Self Care | Payer: BLUE CROSS/BLUE SHIELD | Source: Ambulatory Visit | Attending: Obstetrics and Gynecology | Admitting: Obstetrics and Gynecology

## 2017-04-09 DIAGNOSIS — Z1231 Encounter for screening mammogram for malignant neoplasm of breast: Secondary | ICD-10-CM | POA: Diagnosis not present

## 2017-04-30 ENCOUNTER — Other Ambulatory Visit: Payer: Self-pay

## 2017-04-30 ENCOUNTER — Telehealth: Payer: Self-pay | Admitting: Family

## 2017-04-30 MED ORDER — SERTRALINE HCL 25 MG PO TABS: 25.0000 mg | ORAL_TABLET | Freq: Every day | ORAL | 1 refills | Status: DC

## 2017-04-30 MED ORDER — BUPROPION HCL ER (SR) 150 MG PO TB12: 150.0000 mg | ORAL_TABLET | Freq: Two times a day (BID) | ORAL | 1 refills | Status: DC

## 2017-04-30 NOTE — Telephone Encounter (Signed)
medication has been refilled. 

## 2017-04-30 NOTE — Telephone Encounter (Signed)
Pt called and wanted to give Tracey [name] an update on an appt that she had with a psychiatrist. Please advise, thank you!  Call pt @ [phone]

## 2017-04-30 NOTE — Telephone Encounter (Signed)
Spojke to patient and informed her that she would have to schedule a F/U appointment for sleep issues and she wanted to let you know that Dr. Richrd Humbles placed her back on zoloft and wellbutrin for anxiety/ depression.

## 2017-04-30 NOTE — Telephone Encounter (Signed)
noted 

## 2017-06-24 ENCOUNTER — Encounter: Payer: Self-pay | Admitting: Family

## 2017-06-24 ENCOUNTER — Ambulatory Visit (INDEPENDENT_AMBULATORY_CARE_PROVIDER_SITE_OTHER): Payer: BLUE CROSS/BLUE SHIELD | Admitting: Family

## 2017-06-24 VITALS — BP 102/78 | HR 90 | Temp 98.3°F | Ht 62.0 in | Wt 142.8 lb

## 2017-06-24 DIAGNOSIS — J309 Allergic rhinitis, unspecified: Secondary | ICD-10-CM | POA: Diagnosis not present

## 2017-06-24 DIAGNOSIS — F418 Other specified anxiety disorders: Secondary | ICD-10-CM

## 2017-06-24 DIAGNOSIS — F5105 Insomnia due to other mental disorder: Secondary | ICD-10-CM

## 2017-06-24 MED ORDER — SERTRALINE HCL 100 MG PO TABS: 100.0000 mg | ORAL_TABLET | Freq: Every day | ORAL | 1 refills | Status: DC

## 2017-06-24 MED ORDER — ZOLPIDEM TARTRATE ER 6.25 MG PO TBCR: 6.2500 mg | EXTENDED_RELEASE_TABLET | Freq: Every evening | ORAL | 1 refills | Status: DC | PRN

## 2017-06-24 MED ORDER — LEVOCETIRIZINE DIHYDROCHLORIDE 5 MG PO TABS: 5.0000 mg | ORAL_TABLET | Freq: Every day | ORAL | 1 refills | Status: DC

## 2017-06-24 MED ORDER — MONTELUKAST SODIUM 10 MG PO TABS: 10.0000 mg | ORAL_TABLET | Freq: Every day | ORAL | 1 refills | Status: DC

## 2017-06-24 NOTE — Patient Instructions (Addendum)
Stop on wellbutrin, start taking 150mg  every other day for one week and then take every 3rd for one week, then stop.   Start [place] again. Please be very careful.   Do not take ambien the same night you first start back on Xyzal, singulair as all these medications, including Zoloft, are very sedating.   Follow up one month

## 2017-06-24 NOTE — Progress Notes (Signed)
Pre visit review using our clinic review tool, if applicable. No additional management support is needed unless otherwise documented below in the visit note. 

## 2017-06-24 NOTE — Assessment & Plan Note (Signed)
Stable. Will continue to monitor. Refilled medications.

## 2017-06-24 NOTE — Progress Notes (Signed)
Subjective:    Patient ID: Tracey [name], female    DOB: 04-Nov-1970, 46 y.o.   MRN: [id number]  CC: Tracey [name] is a 46 y.o. female who presents today for follow up.   HPI: Insomnia- on 100mg  zoloft qhs. Back on wellbutrin 150mg  however 'cannot see difference.' Not sleeping well, anxiety is 'so high' because of lack of sleep making it worse. Problems in falling ans staying asleep. Trazodone didn't help, even at increased dose, 100mg . Had been on '12 mg Lorrin Mais ' in the past and would like to go back to this dose. Would like to see counselor.   Hadn't been able to get to gym.   Would like refill of allergy medications and to follow here for allergy refills. Denies urinary retention, increased fatigue, somnolence on current regimen. Endorses dry cough and hoarseness past several days. Occasionally has SOB and uses inhaler with relief.Uses rarely.  NO CP.     Patient was seen by Dr. D[facility] 4 months ago and started on Zoloft, trazodone.  HISTORY:  Past Medical History:  Diagnosis Date  . Allergy    Seasonal  . Anxiety   . Cancer ([place]) skin  . Depression   . Frequent headaches    Past Surgical History:  Procedure Laterality Date  . FOOT SURGERY Left   . TUBAL LIGATION  2013   Family History  Problem Relation Age of Onset  . Breast cancer Paternal Grandmother   . Diabetes Paternal Grandmother   . Cancer Father   . Alcohol abuse Brother   . Drug abuse Brother   . Anxiety disorder Brother   . Depression Brother     Allergies: Patient has no known allergies. Current Outpatient Prescriptions on File Prior to Visit  Medication Sig Dispense Refill  . EPINEPHrine (EPIPEN 2-PAK) 0.3 mg/0.3 mL IJ SOAJ injection     . PROAIR RESPICLICK 786 (90 Base) MCG/ACT AEPB   0   No current facility-administered medications on file prior to visit.     Social History  Substance Use Topics  . Smoking status: Never Smoker  . Smokeless tobacco: Never Used  .  Alcohol use 2.4 - 4.8 oz/week    2 Shots of liquor, 2 - 6 Glasses of wine per week    Review of Systems  Constitutional: Negative for chills and fever.  HENT: Positive for postnasal drip and rhinorrhea.   Respiratory: Negative for cough.   Cardiovascular: Negative for chest pain and palpitations.  Gastrointestinal: Negative for nausea and vomiting.  Psychiatric/Behavioral: Positive for sleep disturbance. The patient is nervous/anxious.       Objective:    BP 102/78   Pulse 90   Temp 98.3 F (36.8 C) (Oral)   Ht 5\' 2"  (1.575 m)   Wt 142 lb 12.8 oz (64.8 kg)   SpO2 98%   BMI 26.12 kg/m  BP Readings from Last 3 Encounters:  06/24/17 102/78  02/21/17 105/71  02/14/17 126/78   Wt Readings from Last 3 Encounters:  06/24/17 142 lb 12.8 oz (64.8 kg)  02/21/17 136 lb 6.4 oz (61.9 kg)  02/14/17 138 lb 6.4 oz (62.8 kg)    Physical Exam  Constitutional: She appears well-developed and well-nourished.  Eyes: Conjunctivae are normal.  Cardiovascular: Normal rate, regular rhythm, normal heart sounds and normal pulses.   Pulmonary/Chest: Effort normal and breath sounds normal. She has no wheezes. She has no rhonchi. She has no rales.  Neurological: She is alert.  Skin: Skin is  warm and dry.  Psychiatric: She has a normal mood and affect. Her speech is normal and behavior is normal. Thought content normal.  Vitals reviewed.      Assessment & Plan:   Problem List Items Addressed This Visit      Respiratory   Allergic rhinitis    Stable. Will continue to monitor. Refilled medications.       Relevant Medications   levocetirizine (XYZAL) 5 MG tablet   montelukast (SINGULAIR) 10 MG tablet     Other   Insomnia secondary to depression with anxiety - Primary    Worsening and suspect poor quality of sleep exacerbating. Advised to stop wellbutrin. Stay on zoloft for now. Will start back on ambien CR 6.25mg . Controlled substance contract in place. Discussed risks of this medication -  particularly somnolence - on current regimen. Extreme caution advised and patient understood risks and would like to go back on medication. Follow up one month.       Relevant Medications   zolpidem (AMBIEN CR) 6.25 MG CR tablet   sertraline (ZOLOFT) 100 MG tablet   Other Relevant Orders   Ambulatory referral to Psychology       I have discontinued Ms. Currington's traZODone, hydrOXYzine, and buPROPion. I have also changed her levocetirizine, montelukast, and sertraline. Additionally, I am having her start on zolpidem. Lastly, I am having her maintain her EPINEPHrine and PROAIR RESPICLICK.   Meds ordered this encounter  Medications  . levocetirizine (XYZAL) 5 MG tablet    Sig: Take 1 tablet (5 mg total) by mouth daily.    Dispense:  90 tablet    Refill:  1  . montelukast (SINGULAIR) 10 MG tablet    Sig: Take 1 tablet (10 mg total) by mouth daily.    Dispense:  90 tablet    Refill:  1  . zolpidem (AMBIEN CR) 6.25 MG CR tablet    Sig: Take 1 tablet (6.25 mg total) by mouth at bedtime as needed for sleep.    Dispense:  30 tablet    Refill:  1    Order Specific Question:   Supervising Provider    Answer:   Deborra Medina L [[id number]]  . sertraline (ZOLOFT) 100 MG tablet    Sig: Take 1 tablet (100 mg total) by mouth at bedtime.    Dispense:  90 tablet    Refill:  1    Order Specific Question:   Supervising Provider    Answer:   Crecencio Mc [[id number]]    Return precautions given.   Risks, benefits, and alternatives of the medications and treatment plan prescribed today were discussed, and patient expressed understanding.   Education regarding symptom management and diagnosis given to patient on AVS.  Continue to follow with Burnard Hawthorne, FNP for routine health maintenance.   [place] and I agreed with plan.   Mable Paris, FNP

## 2017-06-24 NOTE — Assessment & Plan Note (Signed)
Worsening and suspect poor quality of sleep exacerbating. Advised to stop wellbutrin. Stay on zoloft for now. Will start back on ambien CR 6.25mg . Controlled substance contract in place. Discussed risks of this medication - particularly somnolence - on current regimen. Extreme caution advised and patient understood risks and would like to go back on medication. Follow up one month.

## 2017-07-21 ENCOUNTER — Encounter: Payer: Self-pay | Admitting: Intensive Care

## 2017-07-21 ENCOUNTER — Emergency Department
Admission: EM | Admit: 2017-07-21 | Discharge: 2017-07-21 | Disposition: A | Discharge status: Home / Self Care | Payer: BLUE CROSS/BLUE SHIELD | Attending: Emergency Medicine | Admitting: Emergency Medicine

## 2017-07-21 ENCOUNTER — Emergency Department: Payer: BLUE CROSS/BLUE SHIELD

## 2017-07-21 DIAGNOSIS — Y999 Unspecified external cause status: Secondary | ICD-10-CM | POA: Insufficient documentation

## 2017-07-21 DIAGNOSIS — Y92018 Other place in single-family (private) house as the place of occurrence of the external cause: Secondary | ICD-10-CM | POA: Diagnosis not present

## 2017-07-21 DIAGNOSIS — Y9389 Activity, other specified: Secondary | ICD-10-CM | POA: Diagnosis not present

## 2017-07-21 DIAGNOSIS — W458XXA Other foreign body or object entering through skin, initial encounter: Secondary | ICD-10-CM | POA: Insufficient documentation

## 2017-07-21 DIAGNOSIS — S61222A Laceration with foreign body of right middle finger without damage to nail, initial encounter: Secondary | ICD-10-CM | POA: Diagnosis not present

## 2017-07-21 DIAGNOSIS — S60452A Superficial foreign body of right middle finger, initial encounter: Secondary | ICD-10-CM

## 2017-07-21 DIAGNOSIS — M7989 Other specified soft tissue disorders: Secondary | ICD-10-CM | POA: Diagnosis not present

## 2017-07-21 MED ORDER — ONDANSETRON 4 MG PO TBDP: 4.0000 mg | ORAL_TABLET | Freq: Three times a day (TID) | ORAL | 0 refills | Status: DC | PRN

## 2017-07-21 MED ORDER — LIDOCAINE HCL (PF) 1 % IJ SOLN
10.0000 mL | Freq: Once | INTRAMUSCULAR | Status: AC
  Administered 2017-07-21: 10 mL
  Filled 2017-07-21: qty 10

## 2017-07-21 MED ORDER — AMOXICILLIN-POT CLAVULANATE 875-125 MG PO TABS: 1.0000 | ORAL_TABLET | Freq: Two times a day (BID) | ORAL | 0 refills | Status: DC

## 2017-07-21 MED ORDER — TETANUS-DIPHTH-ACELL PERTUSSIS 5-2.5-18.5 LF-MCG/0.5 IM SUSP
0.5000 mL | Freq: Once | INTRAMUSCULAR | Status: AC
  Administered 2017-07-21: 0.5 mL via INTRAMUSCULAR
  Filled 2017-07-21: qty 0.5

## 2017-07-21 MED ORDER — TRAMADOL HCL 50 MG PO TABS: 50.0000 mg | ORAL_TABLET | Freq: Four times a day (QID) | ORAL | 0 refills | Status: DC | PRN

## 2017-07-21 NOTE — ED Triage Notes (Signed)
Patient here for splinter in R hand.

## 2017-07-21 NOTE — ED Notes (Signed)
Pt ambulatory at discharge. Pt verbalized understanding of discharge instructions, prescriptions and follow-up care. A&O x4. Skin warm and dry.

## 2017-07-21 NOTE — ED Notes (Addendum)
Pt grabbed for carpet that she had just pulled up and got a large splinter through the tip of her middle right finger from her porch. Splinter appears crooked. Unknown when last tetanus shot was.

## 2017-07-21 NOTE — ED Provider Notes (Signed)
[facility] Emergency Department Provider Note  ____________________________________________  Time seen: Approximately 5:11 PM  I have reviewed the triage vital signs and the nursing notes.   HISTORY  Chief Complaint Foreign Body in Skin    HPI Tracey [name] is a 46 y.o. female who presents emergency department complaining of a foreign body in the third digit of her right hand. Patient reports that she was going to pick up some carpet that was laying on her porch, as she grabbed the carpet, she had a sliver of wood penetrate on one aspect of her finger all the way to the other aspect. Splinter is visualized on both aspects, medial and lateral, of the finger. Patient still has good range of motion to the finger. She tried to remove splinter using a needle nose pliers but was unable to remove same. She is unsure of her last shot. No other injury or complaint.   Past Medical History:  Diagnosis Date  . Allergy    Seasonal  . Anxiety   . Cancer ([place]) skin  . Depression   . Frequent headaches     Patient Active Problem List   Diagnosis Date Noted  . Allergic rhinitis 06/24/2017  . Concentration deficit 02/14/2017  . Encounter to establish care 08/28/2015  . Frequent headaches 08/28/2015  . History of endometrial ablation 08/28/2015  . Insomnia secondary to depression with anxiety 08/28/2015    Past Surgical History:  Procedure Laterality Date  . FOOT SURGERY Left   . TUBAL LIGATION  2013    Prior to Admission medications   Medication Sig Start Date End Date Taking? Authorizing Provider  amoxicillin-clavulanate (AUGMENTIN) 875-125 MG tablet Take 1 tablet by mouth 2 (two) times daily. 07/21/17   Ronita Hargreaves, Charline Bills, PA-C  EPINEPHrine (EPIPEN 2-PAK) 0.3 mg/0.3 mL IJ SOAJ injection  05/16/15   [provider]  levocetirizine (XYZAL) 5 MG tablet Take 1 tablet (5 mg total) by mouth daily. 06/24/17   Burnard Hawthorne, FNP  montelukast  (SINGULAIR) 10 MG tablet Take 1 tablet (10 mg total) by mouth daily. 06/24/17   Burnard Hawthorne, FNP  ondansetron (ZOFRAN-ODT) 4 MG disintegrating tablet Take 1 tablet (4 mg total) by mouth every 8 (eight) hours as needed for nausea or vomiting. 07/21/17   Shanterica Biehler, Charline Bills, PA-C  PROAIR RESPICLICK 829 [phone] Base) MCG/ACT AEPB  10/19/15   [provider]  sertraline (ZOLOFT) 100 MG tablet Take 1 tablet (100 mg total) by mouth at bedtime. 06/24/17   Burnard Hawthorne, FNP  traMADol (ULTRAM) 50 MG tablet Take 1 tablet (50 mg total) by mouth every 6 (six) hours as needed. 07/21/17   Rogan Ecklund, Charline Bills, PA-C  zolpidem (AMBIEN CR) 6.25 MG CR tablet Take 1 tablet (6.25 mg total) by mouth at bedtime as needed for sleep. 06/24/17   Burnard Hawthorne, FNP    Allergies Patient has no known allergies.  Family History  Problem Relation Age of Onset  . Breast cancer Paternal Grandmother   . Diabetes Paternal Grandmother   . Cancer Father   . Alcohol abuse Brother   . Drug abuse Brother   . Anxiety disorder Brother   . Depression Brother     Social History Social History  Substance Use Topics  . Smoking status: Never Smoker  . Smokeless tobacco: Never Used  . Alcohol use 2.4 - 4.8 oz/week    2 Shots of liquor, 2 - 6 Glasses of wine per week  Review of Systems  Constitutional: No fever/chills Cardiovascular: no chest pain. Respiratory: no cough. No SOB. Gastrointestinal: No abdominal pain.  No nausea, no vomiting.   Musculoskeletal: Negative for musculoskeletal pain. Skin: Positive for him that his foot or to the third digit of the right hand. Neurological: Negative for headaches, focal weakness or numbness. 10-point ROS otherwise negative.  ____________________________________________   PHYSICAL EXAM:  VITAL SIGNS: ED Triage Vitals  Enc Vitals Group     BP 07/21/17 1704 (!) 150/93     Pulse Rate 07/21/17 1704 (!) 114     Resp 07/21/17 1704 18     Temp 07/21/17  1704 97.6 F (36.4 C)     Temp Source 07/21/17 1704 Oral     SpO2 07/21/17 1704 99 %     Weight 07/21/17 1704 135 lb (61.2 kg)     Height 07/21/17 1704 5\' 2"  (1.575 m)     Head Circumference --      Peak Flow --      Pain Score 07/21/17 1703 3     Pain Loc --      Pain Edu? --      Excl. in [place]? --      Constitutional: Alert and oriented. Well appearing and in no acute distress. Eyes: Conjunctivae are normal. PERRL. EOMI. Head: Atraumatic. Neck: No stridor.    Cardiovascular: Normal rate, regular rhythm. Normal S1 and S2.  Good peripheral circulation. Respiratory: Normal respiratory effort without tachypnea or retractions. Lungs CTAB. Good air entry to the bases with no decreased or absent breath sounds. Musculoskeletal: Full range of motion to all extremities. No gross deformities appreciated. Visualized for foreign body, when splinter, noted to the third digit of the right hand. Basis ponder is visualized on the medial aspect, penetrate all the way to the lateral aspect. Good range of motion to the digit. Sensation Refill intact. This penetrates over the distal phalanx. Neurologic:  Normal speech and language. No gross focal neurologic deficits are appreciated.  Skin:  Skin is warm, dry and intact. No rash noted. Psychiatric: Mood and affect are normal. Speech and behavior are normal. Patient exhibits appropriate insight and judgement.   ____________________________________________   LABS (all labs ordered are listed, but only abnormal results are displayed)  Labs Reviewed - No data to display ____________________________________________  EKG   ____________________________________________  RADIOLOGY Diamantina Providence Osmara Drummonds, personally viewed and evaluated these images (plain radiographs) as part of my medical decision making, as well as reviewing the written report by the radiologist.  Dg Finger Middle Right  Result Date: 07/21/2017 CLINICAL DATA:  Rule out foreign  body.  Wood splinter EXAM: RIGHT MIDDLE FINGER 2+V COMPARISON:  None. FINDINGS: No significant bony abnormality. Soft tissue swelling. Probable gas in the ventral soft tissues. No radiopaque foreign body. IMPRESSION: Soft tissue swelling. Negative for foreign body. Note that wood may not be visible on x-ray. Electronically Signed   By: Franchot Gallo M.D.   On: 07/21/2017 18:50    ____________________________________________    PROCEDURES  Procedure(s) performed:    .Foreign Body Removal Date/Time: 07/21/2017 6:20 PM Performed by: Betha Loa D Authorized by: Betha Loa D  Consent: Verbal consent obtained. Risks and benefits: risks, benefits and alternatives were discussed Consent given by: patient Patient understanding: patient states understanding of the procedure being performed Patient identity confirmed: verbally with patient Body area: skin General location: upper extremity Location details: right long finger Anesthesia: digital block  Anesthesia: Local Anesthetic: lidocaine 1% without epinephrine Anesthetic  total: 7 mL  Sedation: Patient sedated: no Patient restrained: no Patient cooperative: yes Localization method: visualized Removal mechanism: hemostat, scalpel and forceps Dressing: dressing applied Tendon involvement: none Depth: deep Complexity: simple 3 objects recovered. Objects recovered: wooden splinters Post-procedure assessment: foreign body removed Patient tolerance: Patient tolerated the procedure well with no immediate complications Comments: Patient with visualized long splinter entering the medial aspect of the third digit of the right hand and exiting the lateral aspect. After digital block, large section of splinter is removed using hemostats and forceps. 2 small residual foreign bodies remain which were removed using superficial incision with scalpel and manual removal with spider forceps. No visualized retained foreign body.  X-ray is obtained status post removal foreign body to ensure complete removal of all foreign body. This returns with no evidence of retained foreign body. Patient's capillary refill, sensation, range of motion is intact both prior to and status post procedure.      Medications  lidocaine (PF) (XYLOCAINE) 1 % injection 10 mL (10 mLs Infiltration Given 07/21/17 1732)  Tdap (BOOSTRIX) injection 0.5 mL (0.5 mLs Intramuscular Given 07/21/17 1845)     ____________________________________________   INITIAL IMPRESSION / ASSESSMENT AND PLAN / ED COURSE  Pertinent labs & imaging results that were available during my care of the patient were reviewed by me and considered in my medical decision making (see chart for details).  Review of the  [place] was performed in accordance of the [place] prior to dispensing any controlled drugs.     Patient's diagnosis is consistent with foreign body to the third digit of the right hand. This is successfully removed as described above. Differential included foreign body or laceration. Obvious foreign body is appreciated. Patient's tetanus shot is updated at this time.. Patient will be discharged home with prescriptions for antibiotics prophylactically, limited prescription for Ultram and ondansetron is also prescribed. Patient is to follow up with primary care as needed or otherwise directed. Patient is given ED precautions to return to the ED for any worsening or new symptoms.     ____________________________________________  FINAL CLINICAL IMPRESSION(S) / ED DIAGNOSES  Final diagnoses:  Foreign body of right middle finger      NEW MEDICATIONS STARTED DURING THIS VISIT:  Discharge Medication List as of 07/21/2017  6:36 PM    START taking these medications   Details  amoxicillin-clavulanate (AUGMENTIN) 875-125 MG tablet Take 1 tablet by mouth 2 (two) times daily., Starting Sun 07/21/2017, Print    ondansetron (ZOFRAN-ODT) 4 MG disintegrating tablet  Take 1 tablet (4 mg total) by mouth every 8 (eight) hours as needed for nausea or vomiting., Starting Sun 07/21/2017, Print    traMADol (ULTRAM) 50 MG tablet Take 1 tablet (50 mg total) by mouth every 6 (six) hours as needed., Starting Sun 07/21/2017, Print            This chart was dictated using voice recognition software/Dragon. Despite best efforts to proofread, errors can occur which can change the meaning. Any change was purely unintentional.    Darletta Moll, PA-C 07/21/17 Zollie Beckers, MD 07/22/17 [phone]

## 2017-09-03 ENCOUNTER — Other Ambulatory Visit: Payer: Self-pay | Admitting: Family

## 2017-09-03 DIAGNOSIS — F5105 Insomnia due to other mental disorder: Principal | ICD-10-CM

## 2017-09-03 DIAGNOSIS — F418 Other specified anxiety disorders: Secondary | ICD-10-CM

## 2017-09-26 DIAGNOSIS — M47896 Other spondylosis, lumbar region: Secondary | ICD-10-CM | POA: Diagnosis not present

## 2017-09-26 DIAGNOSIS — M4316 Spondylolisthesis, lumbar region: Secondary | ICD-10-CM | POA: Diagnosis not present

## 2017-10-09 DIAGNOSIS — M47896 Other spondylosis, lumbar region: Secondary | ICD-10-CM | POA: Diagnosis not present

## 2017-10-09 DIAGNOSIS — M4316 Spondylolisthesis, lumbar region: Secondary | ICD-10-CM | POA: Diagnosis not present

## 2017-10-10 DIAGNOSIS — M2550 Pain in unspecified joint: Secondary | ICD-10-CM | POA: Diagnosis not present

## 2017-10-17 DIAGNOSIS — M2550 Pain in unspecified joint: Secondary | ICD-10-CM | POA: Diagnosis not present

## 2017-10-24 DIAGNOSIS — M2550 Pain in unspecified joint: Secondary | ICD-10-CM | POA: Diagnosis not present

## 2017-11-07 DIAGNOSIS — M2550 Pain in unspecified joint: Secondary | ICD-10-CM | POA: Diagnosis not present

## 2017-11-18 DIAGNOSIS — M5416 Radiculopathy, lumbar region: Secondary | ICD-10-CM | POA: Diagnosis not present

## 2017-11-20 DIAGNOSIS — M5416 Radiculopathy, lumbar region: Secondary | ICD-10-CM | POA: Diagnosis not present

## 2017-12-11 DIAGNOSIS — M47817 Spondylosis without myelopathy or radiculopathy, lumbosacral region: Secondary | ICD-10-CM | POA: Diagnosis not present

## 2017-12-12 DIAGNOSIS — M4310 Spondylolisthesis, site unspecified: Secondary | ICD-10-CM | POA: Diagnosis not present

## 2017-12-12 DIAGNOSIS — M48061 Spinal stenosis, lumbar region without neurogenic claudication: Secondary | ICD-10-CM | POA: Diagnosis not present

## 2017-12-31 ENCOUNTER — Other Ambulatory Visit: Payer: Self-pay | Admitting: Family

## 2017-12-31 DIAGNOSIS — F5105 Insomnia due to other mental disorder: Secondary | ICD-10-CM

## 2017-12-31 DIAGNOSIS — J309 Allergic rhinitis, unspecified: Secondary | ICD-10-CM

## 2017-12-31 DIAGNOSIS — F418 Other specified anxiety disorders: Secondary | ICD-10-CM

## 2018-01-02 ENCOUNTER — Telehealth: Payer: Self-pay

## 2018-01-02 NOTE — Addendum Note (Signed)
Addended by: Leeanne Rio on: 01/02/2018 02:37 PM   Modules accepted: Orders

## 2018-01-02 NOTE — Telephone Encounter (Signed)
Patient called and stated that she is going out of town and would like a refill on zolpidem.

## 2018-01-02 NOTE — Telephone Encounter (Signed)
Patient is calling to follow up on the refill request the pharmacy sent over for levocetirizine (XYZAL) 5 MG tablet and montelukast (SINGULAIR) 10 MG tablet on 12/31/17. She states she is going out of town and needs these by today. Please advise. Patient also stated she needs a refill on zolpidem (AMBIEN CR) 6.25 MG CR tablet she stated she has major surgery on 01/20/18 and would like enough to get her to her appt. Please advise.    [place], [place] [address]  Phone: [phone] Fax: [phone]

## 2018-01-02 NOTE — Telephone Encounter (Signed)
Okay to refill Ambien? Please advise

## 2018-01-02 NOTE — Telephone Encounter (Signed)
Left voicemail  message advising patient refill requesting in process

## 2018-01-03 MED ORDER — ZOLPIDEM TARTRATE ER 6.25 MG PO TBCR: 6.2500 mg | EXTENDED_RELEASE_TABLET | Freq: Every evening | ORAL | 1 refills | Status: DC | PRN

## 2018-01-03 NOTE — Telephone Encounter (Signed)
Call pt  Refilled ambien with do not fill until 01/30/2018 since she filled 12/31/2017.   however please let her know that is a controlled substance she needs to be seen every 4-6 months to remain on this medication.  Please make f/u appt

## 2018-01-03 NOTE — Addendum Note (Signed)
Addended by: Burnard Hawthorne on: 01/03/2018 10:25 AM   Modules accepted: Orders

## 2018-01-03 NOTE — Telephone Encounter (Signed)
Medication refill , see refill under documentation under refill request 01/02/18

## 2018-01-15 DIAGNOSIS — M4310 Spondylolisthesis, site unspecified: Secondary | ICD-10-CM | POA: Diagnosis not present

## 2018-01-15 DIAGNOSIS — Z01812 Encounter for preprocedural laboratory examination: Secondary | ICD-10-CM | POA: Diagnosis not present

## 2018-01-15 DIAGNOSIS — Z01818 Encounter for other preprocedural examination: Secondary | ICD-10-CM | POA: Diagnosis not present

## 2018-01-15 DIAGNOSIS — M5136 Other intervertebral disc degeneration, lumbar region: Secondary | ICD-10-CM | POA: Diagnosis not present

## 2018-01-20 DIAGNOSIS — R112 Nausea with vomiting, unspecified: Secondary | ICD-10-CM | POA: Diagnosis not present

## 2018-01-20 DIAGNOSIS — R110 Nausea: Secondary | ICD-10-CM | POA: Diagnosis not present

## 2018-01-20 DIAGNOSIS — M4316 Spondylolisthesis, lumbar region: Secondary | ICD-10-CM | POA: Diagnosis not present

## 2018-01-20 DIAGNOSIS — M4310 Spondylolisthesis, site unspecified: Secondary | ICD-10-CM | POA: Diagnosis not present

## 2018-01-20 DIAGNOSIS — K567 Ileus, unspecified: Secondary | ICD-10-CM | POA: Diagnosis not present

## 2018-01-20 DIAGNOSIS — K5900 Constipation, unspecified: Secondary | ICD-10-CM | POA: Diagnosis not present

## 2018-01-20 DIAGNOSIS — R109 Unspecified abdominal pain: Secondary | ICD-10-CM | POA: Diagnosis not present

## 2018-01-20 DIAGNOSIS — G4700 Insomnia, unspecified: Secondary | ICD-10-CM | POA: Diagnosis not present

## 2018-01-20 DIAGNOSIS — M48061 Spinal stenosis, lumbar region without neurogenic claudication: Secondary | ICD-10-CM | POA: Diagnosis not present

## 2018-01-20 DIAGNOSIS — M545 Low back pain: Secondary | ICD-10-CM | POA: Diagnosis not present

## 2018-01-20 DIAGNOSIS — J45909 Unspecified asthma, uncomplicated: Secondary | ICD-10-CM | POA: Diagnosis not present

## 2018-01-20 DIAGNOSIS — M5136 Other intervertebral disc degeneration, lumbar region: Secondary | ICD-10-CM | POA: Diagnosis not present

## 2018-01-21 DIAGNOSIS — M545 Low back pain: Secondary | ICD-10-CM | POA: Diagnosis not present

## 2018-01-21 DIAGNOSIS — G4700 Insomnia, unspecified: Secondary | ICD-10-CM | POA: Diagnosis not present

## 2018-01-21 DIAGNOSIS — J45909 Unspecified asthma, uncomplicated: Secondary | ICD-10-CM | POA: Diagnosis not present

## 2018-01-21 DIAGNOSIS — M4310 Spondylolisthesis, site unspecified: Secondary | ICD-10-CM | POA: Diagnosis not present

## 2018-01-22 DIAGNOSIS — K5900 Constipation, unspecified: Secondary | ICD-10-CM | POA: Diagnosis not present

## 2018-01-22 DIAGNOSIS — M545 Low back pain: Secondary | ICD-10-CM | POA: Diagnosis not present

## 2018-01-22 DIAGNOSIS — G4700 Insomnia, unspecified: Secondary | ICD-10-CM | POA: Diagnosis not present

## 2018-01-23 DIAGNOSIS — M545 Low back pain: Secondary | ICD-10-CM | POA: Diagnosis not present

## 2018-01-23 DIAGNOSIS — R109 Unspecified abdominal pain: Secondary | ICD-10-CM | POA: Diagnosis not present

## 2018-01-23 DIAGNOSIS — J45909 Unspecified asthma, uncomplicated: Secondary | ICD-10-CM | POA: Diagnosis not present

## 2018-01-23 DIAGNOSIS — G4700 Insomnia, unspecified: Secondary | ICD-10-CM | POA: Diagnosis not present

## 2018-01-24 DIAGNOSIS — R109 Unspecified abdominal pain: Secondary | ICD-10-CM | POA: Diagnosis not present

## 2018-01-24 DIAGNOSIS — R112 Nausea with vomiting, unspecified: Secondary | ICD-10-CM | POA: Diagnosis not present

## 2018-01-24 DIAGNOSIS — K567 Ileus, unspecified: Secondary | ICD-10-CM | POA: Diagnosis not present

## 2018-01-25 DIAGNOSIS — R112 Nausea with vomiting, unspecified: Secondary | ICD-10-CM | POA: Diagnosis not present

## 2018-01-25 DIAGNOSIS — K567 Ileus, unspecified: Secondary | ICD-10-CM | POA: Diagnosis not present

## 2018-01-25 DIAGNOSIS — R109 Unspecified abdominal pain: Secondary | ICD-10-CM | POA: Diagnosis not present

## 2018-01-28 ENCOUNTER — Telehealth: Payer: Self-pay

## 2018-01-28 NOTE — Telephone Encounter (Signed)
Copied from [place] [id number]. Topic: Inquiry >> Jan 28, 2018  1:54 PM Conception Chancy, NT wrote: Reason for CRM: Manuela Schwartz is a RN calling from [place] and states the patient was in the hospital from 4/22-4/28 for back surgery. She states she is going to [employer]. Just a curtesy call.   Manuela Schwartz Cb# [phone]

## 2018-01-29 NOTE — Telephone Encounter (Signed)
noted 

## 2018-02-04 DIAGNOSIS — M5136 Other intervertebral disc degeneration, lumbar region: Secondary | ICD-10-CM | POA: Diagnosis not present

## 2018-03-13 DIAGNOSIS — M5136 Other intervertebral disc degeneration, lumbar region: Secondary | ICD-10-CM | POA: Diagnosis not present

## 2018-03-14 ENCOUNTER — Other Ambulatory Visit: Payer: Self-pay | Admitting: Obstetrics and Gynecology

## 2018-03-17 DIAGNOSIS — R5383 Other fatigue: Secondary | ICD-10-CM | POA: Diagnosis not present

## 2018-03-17 DIAGNOSIS — N939 Abnormal uterine and vaginal bleeding, unspecified: Secondary | ICD-10-CM | POA: Diagnosis not present

## 2018-03-17 DIAGNOSIS — Z01419 Encounter for gynecological examination (general) (routine) without abnormal findings: Secondary | ICD-10-CM | POA: Diagnosis not present

## 2018-03-24 DIAGNOSIS — N83202 Unspecified ovarian cyst, left side: Secondary | ICD-10-CM | POA: Diagnosis not present

## 2018-03-24 DIAGNOSIS — S0086XA Insect bite (nonvenomous) of other part of head, initial encounter: Secondary | ICD-10-CM | POA: Diagnosis not present

## 2018-03-24 DIAGNOSIS — N83201 Unspecified ovarian cyst, right side: Secondary | ICD-10-CM | POA: Diagnosis not present

## 2018-03-24 DIAGNOSIS — N939 Abnormal uterine and vaginal bleeding, unspecified: Secondary | ICD-10-CM | POA: Diagnosis not present

## 2018-03-31 ENCOUNTER — Other Ambulatory Visit: Payer: Self-pay | Admitting: Obstetrics and Gynecology

## 2018-03-31 DIAGNOSIS — Z1231 Encounter for screening mammogram for malignant neoplasm of breast: Secondary | ICD-10-CM

## 2018-04-04 ENCOUNTER — Other Ambulatory Visit: Payer: Self-pay

## 2018-04-04 DIAGNOSIS — F418 Other specified anxiety disorders: Secondary | ICD-10-CM

## 2018-04-04 DIAGNOSIS — J309 Allergic rhinitis, unspecified: Secondary | ICD-10-CM

## 2018-04-04 DIAGNOSIS — F5105 Insomnia due to other mental disorder: Principal | ICD-10-CM

## 2018-04-04 NOTE — Telephone Encounter (Signed)
lov 06/24/17 New pharmacy

## 2018-04-07 MED ORDER — LEVOCETIRIZINE DIHYDROCHLORIDE 5 MG PO TABS: 5.0000 mg | ORAL_TABLET | Freq: Every day | ORAL | 1 refills | Status: DC

## 2018-04-07 MED ORDER — ZOLPIDEM TARTRATE ER 6.25 MG PO TBCR: 6.2500 mg | EXTENDED_RELEASE_TABLET | Freq: Every evening | ORAL | 1 refills | Status: DC | PRN

## 2018-04-07 MED ORDER — MONTELUKAST SODIUM 10 MG PO TABS: 10.0000 mg | ORAL_TABLET | Freq: Every day | ORAL | 1 refills | Status: DC

## 2018-04-07 NOTE — Telephone Encounter (Signed)
Appointment scheduled .   Script faxed.

## 2018-04-07 NOTE — Telephone Encounter (Signed)
Call pt  What pharmacy? I have printed refills  Please education patient.Ambiem is controlled substance;  In order for me to prescribe medication,  Patient must be seen every 3-6 months. please make follow-up appointment. I refilled for now. She needs f/u for any more refills     I looked up patient on [place] Controlled Substances Reporting System and saw no activity that raised concern of inappropriate use.

## 2018-04-15 DIAGNOSIS — M5136 Other intervertebral disc degeneration, lumbar region: Secondary | ICD-10-CM | POA: Diagnosis not present

## 2018-04-17 DIAGNOSIS — N83202 Unspecified ovarian cyst, left side: Secondary | ICD-10-CM | POA: Diagnosis not present

## 2018-04-17 DIAGNOSIS — N83201 Unspecified ovarian cyst, right side: Secondary | ICD-10-CM | POA: Diagnosis not present

## 2018-05-08 DIAGNOSIS — M5136 Other intervertebral disc degeneration, lumbar region: Secondary | ICD-10-CM | POA: Diagnosis not present

## 2018-05-20 ENCOUNTER — Ambulatory Visit
Admission: RE | Admit: 2018-05-20 | Discharge: 2018-05-20 | Disposition: A | Discharge status: Home / Self Care | Payer: BLUE CROSS/BLUE SHIELD | Source: Ambulatory Visit | Attending: Obstetrics and Gynecology | Admitting: Obstetrics and Gynecology

## 2018-05-20 DIAGNOSIS — Z1231 Encounter for screening mammogram for malignant neoplasm of breast: Secondary | ICD-10-CM | POA: Diagnosis not present

## 2018-05-21 ENCOUNTER — Other Ambulatory Visit: Payer: Self-pay | Admitting: Obstetrics and Gynecology

## 2018-05-21 DIAGNOSIS — R928 Other abnormal and inconclusive findings on diagnostic imaging of breast: Secondary | ICD-10-CM

## 2018-05-28 ENCOUNTER — Ambulatory Visit
Admission: RE | Admit: 2018-05-28 | Discharge: 2018-05-28 | Disposition: A | Discharge status: Home / Self Care | Payer: BLUE CROSS/BLUE SHIELD | Source: Ambulatory Visit | Attending: Obstetrics and Gynecology | Admitting: Obstetrics and Gynecology

## 2018-05-28 DIAGNOSIS — R928 Other abnormal and inconclusive findings on diagnostic imaging of breast: Secondary | ICD-10-CM

## 2018-06-25 ENCOUNTER — Encounter: Payer: Self-pay | Admitting: Family

## 2018-06-25 ENCOUNTER — Ambulatory Visit (INDEPENDENT_AMBULATORY_CARE_PROVIDER_SITE_OTHER): Payer: BLUE CROSS/BLUE SHIELD | Admitting: Family

## 2018-06-25 VITALS — BP 122/82 | HR 102 | Temp 98.1°F | Resp 16 | Ht 62.0 in | Wt 144.5 lb

## 2018-06-25 DIAGNOSIS — J309 Allergic rhinitis, unspecified: Secondary | ICD-10-CM | POA: Diagnosis not present

## 2018-06-25 DIAGNOSIS — F411 Generalized anxiety disorder: Secondary | ICD-10-CM | POA: Diagnosis not present

## 2018-06-25 DIAGNOSIS — F5105 Insomnia due to other mental disorder: Secondary | ICD-10-CM | POA: Diagnosis not present

## 2018-06-25 DIAGNOSIS — F418 Other specified anxiety disorders: Secondary | ICD-10-CM

## 2018-06-25 MED ORDER — BUPROPION HCL ER (XL) 150 MG PO TB24: ORAL_TABLET | ORAL | 3 refills | Status: DC

## 2018-06-25 MED ORDER — ZOLPIDEM TARTRATE ER 6.25 MG PO TBCR: 6.2500 mg | EXTENDED_RELEASE_TABLET | Freq: Every evening | ORAL | 1 refills | Status: DC | PRN

## 2018-06-25 MED ORDER — LEVOCETIRIZINE DIHYDROCHLORIDE 5 MG PO TABS: 5.0000 mg | ORAL_TABLET | Freq: Every day | ORAL | 1 refills | Status: DC

## 2018-06-25 MED ORDER — MONTELUKAST SODIUM 10 MG PO TABS: 10.0000 mg | ORAL_TABLET | Freq: Every day | ORAL | 1 refills | Status: DC

## 2018-06-25 NOTE — Progress Notes (Signed)
Subjective:    Patient ID: Tracey [name], female    DOB: [birth year], 47 y.o.   MRN: [id number]  CC: Tracey [name] is a 47 y.o. female who presents today for follow up.   HPI: Insomnia- ambien low dose does well for her; no sleep walking ,falls. Would like refill  Anixety has improved. She has learned how to talk herself through anxious moments. Exercise helps.   Had been on wellbutrin in the past.  Working out 5 days per week. Eating 'clean' however not loosing weight as she would like.   No seizure history  Alcohol use is rare.   Allergies- controlled; would like a refill of medications  Leg and back pain improved, Still has some numbness in right buttocks and told likely may take a year to resolve.   cysts on lumbar spine- surgery 12/2017. Dr Dimmig  HISTORY:  Past Medical History:  Diagnosis Date  . Allergy    Seasonal  . Anxiety   . Cancer ([place]) skin  . Depression   . Frequent headaches    Past Surgical History:  Procedure Laterality Date  . FOOT SURGERY Left   . TUBAL LIGATION  2013   Family History  Problem Relation Age of Onset  . Breast cancer Paternal Grandmother   . Diabetes Paternal Grandmother   . Cancer Father   . Alcohol abuse Brother   . Drug abuse Brother   . Anxiety disorder Brother   . Depression Brother     Allergies: Patient has no known allergies. Current Outpatient Medications on File Prior to Visit  Medication Sig Dispense Refill  . EPINEPHrine (EPIPEN 2-PAK) 0.3 mg/0.3 mL IJ SOAJ injection     . PROAIR RESPICLICK 381 (90 Base) MCG/ACT AEPB   0   No current facility-administered medications on file prior to visit.     Social History   Tobacco Use  . Smoking status: Never Smoker  . Smokeless tobacco: Never Used  Substance Use Topics  . Alcohol use: Yes    Alcohol/week: 4.0 - 8.0 standard drinks    Types: 2 Shots of liquor, 2 - 6 Glasses of wine per week  . Drug use: No    Review of Systems  Constitutional:  Negative for chills and fever.  Respiratory: Negative for cough.   Cardiovascular: Negative for chest pain and palpitations.  Gastrointestinal: Negative for nausea and vomiting.  Musculoskeletal: Negative for back pain.  Neurological: Positive for numbness (right buttucks, chronic). Negative for seizures.  Psychiatric/Behavioral: Negative for sleep disturbance. The patient is nervous/anxious.       Objective:    BP 122/82 (BP Location: Left Arm, Patient Position: Sitting, Cuff Size: Normal)   Pulse (!) 102   Temp 98.1 F (36.7 C) (Oral)   Resp 16   Ht 5\' 2"  (1.575 m)   Wt 144 lb 8 oz (65.5 kg)   SpO2 100%   BMI 26.43 kg/m  BP Readings from Last 3 Encounters:  06/25/18 122/82  07/21/17 (!) 144/90  06/24/17 102/78   Wt Readings from Last 3 Encounters:  06/25/18 144 lb 8 oz (65.5 kg)  07/21/17 135 lb (61.2 kg)  06/24/17 142 lb 12.8 oz (64.8 kg)    Physical Exam  Constitutional: She appears well-developed and well-nourished.  Eyes: Conjunctivae are normal.  Cardiovascular: Normal rate, regular rhythm, normal heart sounds and normal pulses.  Pulmonary/Chest: Effort normal and breath sounds normal. She has no wheezes. She has no rhonchi. She has no rales.  Neurological:  She is alert.  Skin: Skin is warm and dry.  Psychiatric: She has a normal mood and affect. Her speech is normal and behavior is normal. Thought content normal.  Vitals reviewed.      Assessment & Plan:   Problem List Items Addressed This Visit      Respiratory   Allergic rhinitis    Stable. Refilled medications      Relevant Medications   levocetirizine (XYZAL) 5 MG tablet   montelukast (SINGULAIR) 10 MG tablet     Other   Insomnia secondary to depression with anxiety - Primary    Done well on ambien.  No concerning side effects.  Refilled I looked up patient on [place] Controlled Substances Reporting System and saw no activity that raised concern of inappropriate use.        Relevant Medications     zolpidem (AMBIEN CR) 6.25 MG CR tablet   buPROPion (WELLBUTRIN XL) 150 MG 24 hr tablet   GAD (generalized anxiety disorder)    Controlled.  Off of Zoloft however would like to try Wellbutrin again for the added benefit of weight loss.  She will let me know how she is doing on this medication      Relevant Medications   buPROPion (WELLBUTRIN XL) 150 MG 24 hr tablet       I have discontinued Clent Ridges. Recht's sertraline, amoxicillin-clavulanate, traMADol, and ondansetron. I am also having her start on buPROPion. Additionally, I am having her maintain her EPINEPHrine, PROAIR RESPICLICK, zolpidem, levocetirizine, and montelukast.   Meds ordered this encounter  Medications  . zolpidem (AMBIEN CR) 6.25 MG CR tablet    Sig: Take 1 tablet (6.25 mg total) by mouth at bedtime as needed. for sleep    Dispense:  30 tablet    Refill:  1  . levocetirizine (XYZAL) 5 MG tablet    Sig: Take 1 tablet (5 mg total) by mouth daily.    Dispense:  90 tablet    Refill:  1    Order Specific Question:   Supervising Provider    Answer:   Deborra Medina L [[id number]]  . buPROPion (WELLBUTRIN XL) 150 MG 24 hr tablet    Sig: Start 150 mg ER PO qam, increase after 3 days to 300 mg qam.    Dispense:  60 tablet    Refill:  3    Order Specific Question:   Supervising Provider    Answer:   Deborra Medina L [[id number]]  . montelukast (SINGULAIR) 10 MG tablet    Sig: Take 1 tablet (10 mg total) by mouth daily.    Dispense:  90 tablet    Refill:  1    Order Specific Question:   Supervising Provider    Answer:   Crecencio Mc [[id number]]    Return precautions given.   Risks, benefits, and alternatives of the medications and treatment plan prescribed today were discussed, and patient expressed understanding.   Education regarding symptom management and diagnosis given to patient on AVS.  Continue to follow with Burnard Hawthorne, FNP for routine health maintenance.   [place] and I agreed with plan.    Mable Paris, FNP

## 2018-06-25 NOTE — Patient Instructions (Signed)
Pleasure seeing you  Let me know how you do on the wellbutrin

## 2018-06-25 NOTE — Assessment & Plan Note (Signed)
Done well on ambien.  No concerning side effects.  Refilled I looked up patient on [place] Controlled Substances Reporting System and saw no activity that raised concern of inappropriate use.

## 2018-06-25 NOTE — Assessment & Plan Note (Signed)
Stable. Refilled medications

## 2018-06-25 NOTE — Assessment & Plan Note (Signed)
Controlled.  Off of Zoloft however would like to try Wellbutrin again for the added benefit of weight loss.  She will let me know how she is doing on this medication

## 2018-08-07 DIAGNOSIS — M5136 Other intervertebral disc degeneration, lumbar region: Secondary | ICD-10-CM | POA: Diagnosis not present

## 2018-10-02 ENCOUNTER — Telehealth: Payer: Self-pay | Admitting: *Deleted

## 2018-10-02 NOTE — Telephone Encounter (Signed)
Appointment made

## 2018-10-02 NOTE — Telephone Encounter (Signed)
Copied from [place] [id number]. Topic: General - Other >> Oct 02, 2018  9:13 AM Yvette Rack wrote: Reason for CRM: pt calling wanting to know if she need an appt to get her Ambien filled she thought she had an appt but she don't

## 2018-10-15 ENCOUNTER — Ambulatory Visit: Payer: BLUE CROSS/BLUE SHIELD | Admitting: Family

## 2018-10-15 ENCOUNTER — Encounter: Payer: Self-pay | Admitting: Family

## 2018-10-15 ENCOUNTER — Other Ambulatory Visit: Payer: Self-pay

## 2018-10-15 VITALS — BP 118/84 | HR 98 | Temp 98.3°F | Wt 142.6 lb

## 2018-10-15 DIAGNOSIS — R635 Abnormal weight gain: Secondary | ICD-10-CM | POA: Diagnosis not present

## 2018-10-15 DIAGNOSIS — J309 Allergic rhinitis, unspecified: Secondary | ICD-10-CM

## 2018-10-15 DIAGNOSIS — F418 Other specified anxiety disorders: Secondary | ICD-10-CM

## 2018-10-15 DIAGNOSIS — F411 Generalized anxiety disorder: Secondary | ICD-10-CM | POA: Diagnosis not present

## 2018-10-15 DIAGNOSIS — F5105 Insomnia due to other mental disorder: Secondary | ICD-10-CM

## 2018-10-15 MED ORDER — ZOLPIDEM TARTRATE ER 6.25 MG PO TBCR: 6.2500 mg | EXTENDED_RELEASE_TABLET | Freq: Every evening | ORAL | 1 refills | Status: DC | PRN

## 2018-10-15 NOTE — Assessment & Plan Note (Signed)
Stable on Ambien, will continue medication. I looked up patient on [place] Controlled Substances Reporting System and saw no activity that raised concern of inappropriate use.

## 2018-10-15 NOTE — Patient Instructions (Signed)
Pleasure seeing you  Labs when you can

## 2018-10-15 NOTE — Assessment & Plan Note (Signed)
Stable on current regimen, refilled albuterol inhaler

## 2018-10-15 NOTE — Assessment & Plan Note (Signed)
Doing well on Wellbutrin , will continue.  

## 2018-10-15 NOTE — Progress Notes (Signed)
Subjective:    Patient ID: Tracey [name], female    DOB: [birth year], 48 y.o.   MRN: [id number]  CC: Tracey [name] is a 48 y.o. female who presents today for follow up.   HPI: GAD- wellbutrin started last OV. Feels great on medication.   Insomnia- doing well on ambien. No strange dreams.   Rare alcohol  Allergies- doing well on regimen. Needs inhaler for exercise induced asthma. No cp.   Back pain - at baseline since surgery.  Continues to eat clean, exercise regularly, boxing classes.   usually has labs done with work.     HISTORY:  Past Medical History:  Diagnosis Date  . Allergy    Seasonal  . Anxiety   . Cancer ([place]) skin  . Depression   . Frequent headaches    Past Surgical History:  Procedure Laterality Date  . FOOT SURGERY Left   . TUBAL LIGATION  2013   Family History  Problem Relation Age of Onset  . Breast cancer Paternal Grandmother   . Diabetes Paternal Grandmother   . Cancer Father   . Alcohol abuse Brother   . Drug abuse Brother   . Anxiety disorder Brother   . Depression Brother     Allergies: Patient has no known allergies. Current Outpatient Medications on File Prior to Visit  Medication Sig Dispense Refill  . buPROPion (WELLBUTRIN XL) 150 MG 24 hr tablet Start 150 mg ER PO qam, increase after 3 days to 300 mg qam. 60 tablet 3  . EPINEPHrine (EPIPEN 2-PAK) 0.3 mg/0.3 mL IJ SOAJ injection     . levocetirizine (XYZAL) 5 MG tablet Take 1 tablet (5 mg total) by mouth daily. 90 tablet 1  . montelukast (SINGULAIR) 10 MG tablet Take 1 tablet (10 mg total) by mouth daily. 90 tablet 1  . PROAIR RESPICLICK 417 (90 Base) MCG/ACT AEPB   0   No current facility-administered medications on file prior to visit.     Social History   Tobacco Use  . Smoking status: Never Smoker  . Smokeless tobacco: Never Used  Substance Use Topics  . Alcohol use: Yes    Alcohol/week: 4.0 - 8.0 standard drinks    Types: 2 Shots of liquor, 2 - 6  Glasses of wine per week  . Drug use: No    Review of Systems  Constitutional: Negative for chills and fever.  Respiratory: Negative for cough.   Cardiovascular: Negative for chest pain and palpitations.  Gastrointestinal: Negative for nausea and vomiting.  Psychiatric/Behavioral: Negative for sleep disturbance (controlled) and suicidal ideas. The patient is not nervous/anxious (controlled).       Objective:    BP 118/84 (BP Location: Left Arm, Patient Position: Sitting, Cuff Size: Normal)   Pulse 98   Temp 98.3 F (36.8 C)   Wt 142 lb 9.6 oz (64.7 kg)   SpO2 99%   BMI 26.08 kg/m  BP Readings from Last 3 Encounters:  10/15/18 118/84  06/25/18 122/82  07/21/17 (!) 144/90   Wt Readings from Last 3 Encounters:  10/15/18 142 lb 9.6 oz (64.7 kg)  06/25/18 144 lb 8 oz (65.5 kg)  07/21/17 135 lb (61.2 kg)    Physical Exam Vitals signs reviewed.  Constitutional:      Appearance: She is well-developed.  Eyes:     Conjunctiva/sclera: Conjunctivae normal.  Cardiovascular:     Rate and Rhythm: Normal rate and regular rhythm.     Pulses: Normal pulses.  Heart sounds: Normal heart sounds.  Pulmonary:     Effort: Pulmonary effort is normal.     Breath sounds: Normal breath sounds. No wheezing, rhonchi or rales.  Skin:    General: Skin is warm and dry.  Neurological:     Mental Status: She is alert.  Psychiatric:        Speech: Speech normal.        Behavior: Behavior normal.        Thought Content: Thought content normal.        Assessment & Plan:   Problem List Items Addressed This Visit      Respiratory   Allergic rhinitis    Stable on current regimen, refilled albuterol inhaler        Other   Insomnia secondary to depression with anxiety    Stable on Ambien, will continue medication. I looked up patient on [place] Controlled Substances Reporting System and saw no activity that raised concern of inappropriate use.        Relevant Medications   zolpidem  (AMBIEN CR) 6.25 MG CR tablet   GAD (generalized anxiety disorder)    Doing well on Wellbutrin, will continue       Other Visit Diagnoses    Weight gain    -  Primary   Relevant Orders   TSH       I am having Tracey [name] maintain her EPINEPHrine, PROAIR RESPICLICK, levocetirizine, buPROPion, montelukast, and zolpidem.   Meds ordered this encounter  Medications  . zolpidem (AMBIEN CR) 6.25 MG CR tablet    Sig: Take 1 tablet (6.25 mg total) by mouth at bedtime as needed. for sleep    Dispense:  30 tablet    Refill:  1    Order Specific Question:   Supervising Provider    Answer:   Crecencio Mc [[id number]]    Return precautions given.   Risks, benefits, and alternatives of the medications and treatment plan prescribed today were discussed, and patient expressed understanding.   Education regarding symptom management and diagnosis given to patient on AVS.  Continue to follow with Burnard Hawthorne, FNP for routine health maintenance.   [place] and I agreed with plan.   Mable Paris, FNP

## 2018-10-16 ENCOUNTER — Other Ambulatory Visit (INDEPENDENT_AMBULATORY_CARE_PROVIDER_SITE_OTHER): Payer: BLUE CROSS/BLUE SHIELD

## 2018-10-16 ENCOUNTER — Telehealth: Payer: Self-pay | Admitting: *Deleted

## 2018-10-16 DIAGNOSIS — Z7689 Persons encountering health services in other specified circumstances: Secondary | ICD-10-CM

## 2018-10-16 DIAGNOSIS — R635 Abnormal weight gain: Secondary | ICD-10-CM | POA: Diagnosis not present

## 2018-10-16 LAB — TSH: TSH: 1.56 u[IU]/mL (ref 0.35–4.50)

## 2018-10-16 NOTE — Telephone Encounter (Signed)
Pt asked if we can add on a Vit D & B12 to labs draw today. If agreeable, please place future order & send message back to me. Thanks. Pt states that both of those test were checked at [facility] & were abnormal.

## 2018-10-17 ENCOUNTER — Other Ambulatory Visit (INDEPENDENT_AMBULATORY_CARE_PROVIDER_SITE_OTHER): Payer: BLUE CROSS/BLUE SHIELD

## 2018-10-17 DIAGNOSIS — Z7689 Persons encountering health services in other specified circumstances: Secondary | ICD-10-CM | POA: Diagnosis not present

## 2018-10-17 LAB — B12 AND FOLATE PANEL
Folate: 13.2 ng/mL (ref >5.9)
Vitamin B-12: 259 pg/mL (ref 211–911)

## 2018-10-17 LAB — VITAMIN D 25 HYDROXY (VIT D DEFICIENCY, FRACTURES): VITD: 25.2 ng/mL — ABNORMAL LOW (ref 30.00–100.00)

## 2018-10-17 NOTE — Telephone Encounter (Signed)
Yes, will forward to [place] for add-on.

## 2018-10-17 NOTE — Telephone Encounter (Signed)
That's fine I ordered labs

## 2018-10-17 NOTE — Telephone Encounter (Signed)
Add on sheet faxed over

## 2018-11-13 DIAGNOSIS — M5136 Other intervertebral disc degeneration, lumbar region: Secondary | ICD-10-CM | POA: Diagnosis not present

## 2018-11-17 DIAGNOSIS — M7711 Lateral epicondylitis, right elbow: Secondary | ICD-10-CM | POA: Diagnosis not present

## 2018-12-18 ENCOUNTER — Other Ambulatory Visit: Payer: Self-pay

## 2018-12-18 ENCOUNTER — Ambulatory Visit
Admission: EM | Admit: 2018-12-18 | Discharge: 2018-12-18 | Disposition: A | Discharge status: Home / Self Care | Payer: BLUE CROSS/BLUE SHIELD | Attending: Emergency Medicine | Admitting: Emergency Medicine

## 2018-12-18 ENCOUNTER — Encounter: Payer: Self-pay | Admitting: Emergency Medicine

## 2018-12-18 DIAGNOSIS — J029 Acute pharyngitis, unspecified: Secondary | ICD-10-CM

## 2018-12-18 DIAGNOSIS — R05 Cough: Secondary | ICD-10-CM

## 2018-12-18 DIAGNOSIS — R0981 Nasal congestion: Secondary | ICD-10-CM | POA: Diagnosis not present

## 2018-12-18 DIAGNOSIS — J302 Other seasonal allergic rhinitis: Secondary | ICD-10-CM

## 2018-12-18 DIAGNOSIS — R51 Headache: Secondary | ICD-10-CM | POA: Diagnosis not present

## 2018-12-18 DIAGNOSIS — J0140 Acute pansinusitis, unspecified: Secondary | ICD-10-CM

## 2018-12-18 LAB — RAPID STREP SCREEN (MED CTR MEBANE ONLY): STREPTOCOCCUS, GROUP A SCREEN (DIRECT): NEGATIVE

## 2018-12-18 MED ORDER — AEROCHAMBER PLUS MISC: 2 refills | Status: DC

## 2018-12-18 NOTE — Discharge Instructions (Addendum)
But strep was negative today.  We are sending the specimen off for culture to make absolutely sure that you do not have a infection requiring antibiotics.  We will call you if it comes back positive for an infection.  Continue Xyzal, Singulair, Sudafed, start Nasonex.   Start the albuterol inhaler 2 puffs every 4-6 hours with a spacer as needed for coughing.  600 mg of ibuprofen combined with 1 g Tylenol 3 or 4 times a day as needed for headache, sore throat, 5 cc of Benadryl mixed with 5 cc of Maalox together 3 or 4 times a day.  This will help soothe your throat.  Traditional medicinal's "throat coat tea" also helps.  If the Xyzal Sudafed combination does not work, discontinue it and start Mucinex D instead.  Start saline nasal irrigation with a Milta Deiters med sinus rinse and distilled water as often as you want.

## 2018-12-18 NOTE — ED Provider Notes (Signed)
HPI  SUBJECTIVE:  Tracey [name] is a 48 y.o. female who presents with sore throat, headache, cough, sneezing, itchy, watery eyes, sinus pain and pressure, upper dental pain, extensive clear rhinorrhea and nasal congestion starting yesterday.  She states that her glands feel swollen.  She reports chest congestion with a cough.  No postnasal drip, muffled voice, drooling, trismus, neck stiffness.  No sensation of throat swelling shut, difficulty breathing.  No abdominal pain, rash.  No fevers, body aches.  She is able to sleep at night without waking up coughing.  She took Excedrin this morning with improvement in her symptoms.  No antibiotics in the past month.  She states that her allergies are acting up.  She has also tried Sudafed with improvement in her symptoms.  Symptoms are worse with going outside and with exercising.  No known contacts with strep or mono.  She states that she has Flonase at home, but has not started using it.  States that her boyfriend had similar symptoms last week.  She has a past medical history of seasonal allergies for which she takes Xyzal and Singulair on an ongoing basis.  She has a past medical history of asthma and has an inhaler but no spacer.  No history of diabetes, hypertension, frequent sinusitis.  LMP: None.  Status post ablation.  [id number]:RCVELF, Yvetta Coder, FNP   Past Medical History:  Diagnosis Date  . Allergy    Seasonal  . Anxiety   . Cancer ([place]) skin  . Depression   . Frequent headaches     Past Surgical History:  Procedure Laterality Date  . BACK SURGERY    . FOOT SURGERY Left   . TUBAL LIGATION  2013    Family History  Problem Relation Age of Onset  . Breast cancer Paternal Grandmother   . Diabetes Paternal Grandmother   . Other Mother        accidental death  . Cancer Father        brain tumor  . Melanoma Father   . Lung cancer Father   . Alcohol abuse Brother   . Drug abuse Brother   . Anxiety disorder Brother   . Depression  Brother     Social History   Tobacco Use  . Smoking status: Never Smoker  . Smokeless tobacco: Never Used  Substance Use Topics  . Alcohol use: Not Currently    Alcohol/week: 4.0 - 8.0 standard drinks    Types: 2 - 6 Glasses of wine, 2 Shots of liquor per week  . Drug use: No    No current facility-administered medications for this encounter.   Current Outpatient Medications:  .  buPROPion (WELLBUTRIN XL) 150 MG 24 hr tablet, Start 150 mg ER PO qam, increase after 3 days to 300 mg qam., Disp: 60 tablet, Rfl: 3 .  EPINEPHrine (EPIPEN 2-PAK) 0.3 mg/0.3 mL IJ SOAJ injection, , Disp: , Rfl:  .  gabapentin (NEURONTIN) 300 MG capsule, gabapentin 300 mg capsule  Take 1 capsule(s) 3 times a day by oral route., Disp: , Rfl:  .  levocetirizine (XYZAL) 5 MG tablet, Take 1 tablet (5 mg total) by mouth daily., Disp: 90 tablet, Rfl: 1 .  montelukast (SINGULAIR) 10 MG tablet, Take 1 tablet (10 mg total) by mouth daily., Disp: 90 tablet, Rfl: 1 .  PROAIR RESPICLICK 810 (90 Base) MCG/ACT AEPB, , Disp: , Rfl: 0 .  zolpidem (AMBIEN CR) 6.25 MG CR tablet, Take 1 tablet (6.25 mg total) by mouth  at bedtime as needed. for sleep, Disp: 30 tablet, Rfl: 1 .  Spacer/Aero-Holding Chambers (AEROCHAMBER PLUS) inhaler, Use as instructed, Disp: 1 each, Rfl: 2  Allergies  Allergen Reactions  . Molds & Smuts     Other reaction(s): Other (See Comments)     ROS  As noted in HPI.   Physical Exam  BP 123/84 (BP Location: Left Arm)   Pulse 88   Temp 98.4 F (36.9 C) (Oral)   Resp 16   Ht 5\' 2"  (1.575 m)   Wt 61.2 kg   SpO2 100%   BMI 24.69 kg/m   Constitutional: Well developed, well nourished, no acute distress Eyes:  EOMI, conjunctiva normal bilaterally HENT: Normocephalic, atraumatic,mucus membranes moist . extensive clear rhinorrhea.  Erythematous, swollen turbinates.  Mild maxillary and frontal sinus tenderness.  TMs normal bilaterally.  Left external ear and ear canal normal.  Positive tender  single preauricular lymph node.  No TMJ tenderness.  Slightly erythematous oropharynx, normal tonsils without exudates.  Uvula midline.  No cobblestoning, postnasal drip. Neck: Positive left-sided anterior cervical adenopathy Respiratory: Normal inspiratory effort.  Rhonchi left upper lobe.  No wheezing, rales. Cardiovascular: Normal rate regular rhythm, no murmurs rubs or gallops  GI: nondistended, no splenomegaly skin: No rash, skin intact Musculoskeletal: no deformities Neurologic: Alert & oriented x 3, no focal neuro deficits Psychiatric: Speech and behavior appropriate   ED Course   Medications - No data to display  Orders Placed This Encounter  Procedures  . Rapid Strep Screen ([facility] ONLY)    Standing Status:   Standing    Number of Occurrences:   1  . Culture, group A strep    Standing Status:   Standing    Number of Occurrences:   1    Results for orders placed or performed during the hospital encounter of 12/18/18 (from the past 24 hour(s))  Rapid Strep Screen ([facility] ONLY)     Status: None   Collection Time: 12/18/18  8:34 AM  Result Value Ref Range   Streptococcus, Group A Screen (Direct) NEGATIVE NEGATIVE   No results found.  ED Clinical Impression  Seasonal allergies  Acute non-recurrent pansinusitis  Acute pharyngitis, unspecified etiology   ED Assessment/Plan  Difficult to tell whether this is infectious or allergies.  We will try treating as if this is allergies first.  Continue Xyzal, Singulair, Sudafed, start Nasonex.  Patient states that she does not need a prescription for this.  Start the albuterol inhaler 2 puffs every 4-6 hours with a spacer as needed for coughing.  600 mg of ibuprofen combined with 1 g Tylenol 3 or 4 times a day as needed for headache, sore throat, Benadryl/Maalox mixture.  If the antihistamine/decongestant combination does not work, she is to discontinue it and start Mucinex D instead.  Start saline nasal  irrigation with a Milta Deiters med sinus rinse and distilled water as often as she wants. No Clear indications for antibiotics today.  Will write a work note to return.  Seriously doubt COVID 19.  Discussed labs,  MDM, treatment plan, and plan for follow-up with patient.patient agrees with plan.   Meds ordered this encounter  Medications  . Spacer/Aero-Holding Chambers (AEROCHAMBER PLUS) inhaler    Sig: Use as instructed    Dispense:  1 each    Refill:  2    *This clinic note was created using Dragon dictation software. Therefore, there may be occasional mistakes despite careful proofreading.   ?  Melynda Ripple, MD 12/18/18 1105

## 2018-12-18 NOTE — ED Triage Notes (Addendum)
Patient in today c/o sinus pressure/pain, cough and sore throat x 2 days. Patient denies fever. Patient has tried OTC Sudafed without relief. Patient states she has really bad allergies. Patient states she needs a note to be able to go to work.

## 2018-12-20 LAB — CULTURE, GROUP A STREP (THRC)

## 2018-12-22 ENCOUNTER — Telehealth (HOSPITAL_COMMUNITY): Payer: Self-pay | Admitting: Emergency Medicine

## 2018-12-22 NOTE — Telephone Encounter (Signed)
Culture is positive for non group A Strep germ.  This is a finding of uncertain significance; not the typical 'strep throat' germ. LVMM

## 2018-12-31 HISTORY — PX: BACK SURGERY: SHX140

## 2019-01-13 ENCOUNTER — Telehealth: Payer: Self-pay

## 2019-01-13 ENCOUNTER — Ambulatory Visit: Payer: Self-pay

## 2019-01-13 NOTE — Telephone Encounter (Signed)
Pt called stating that she has gout flare up in both of her feet. She states that she was diagnosed with gout 7-8 years ago in her left foot.  She states that today it is in both feet.  She states that when she walks and gets on the balls of her feet she gets shooting pain through her great toes. She rates the pain at 3-4.  She states that there is very little swelling today.  She states that she has eaten foods that she knows she should avoid over the past few weeks. Per protocol call was placed to office.x 2 attempts. No answer. Voice message left on FC line to call patient for set up of web visit. E-mail verified.  Reason for Disposition . [1] MODERATE pain (e.g., interferes with normal activities, limping) AND [2] present > 3 days  Answer Assessment - Initial Assessment Questions 1. ONSET: "When did the pain start?"      2 weeks ago 2. LOCATION: "Where is the pain located?"      Both feet big toe joints 3. PAIN: "How bad is the pain?"    (Scale 1-10; or mild, moderate, severe)   -  MILD (1-3): doesn't interfere with normal activities    -  MODERATE (4-7): interferes with normal activities (e.g., work or school) or awakens from sleep, limping    -  SEVERE (8-10): excruciating pain, unable to do any normal activities, unable to walk     3-4 pain shoots as she gets to the balls of her feet 4. WORK OR EXERCISE: "Has there been any recent work or exercise that involved this part of the body?"     no 5. CAUSE: "What do you think is causing the foot pain?"     gout 6. OTHER SYMPTOMS: "Do you have any other symptoms?" (e.g., leg pain, rash, fever, numbness)     no 7. PREGNANCY: "Is there any chance you are pregnant?" "When was your last menstrual period?"    No  Protocols used: FOOT PAIN-A-AH

## 2019-01-13 NOTE — Telephone Encounter (Signed)
Copied from [place] [id number]. Topic: Complaint - Billing/Coding >> Jan 13, 2019  9:13 AM Virl Axe D wrote: DOS: 10/16/18 Details of complaint: Pt stated she received a $200 bill for lab work due to it being coded incorrectly. How would the patient like to see this issue resolved? Pt is asking for bill to be recoded and resubmitted.   Will automatically be routed to [place] pool.

## 2019-01-13 NOTE — Telephone Encounter (Signed)
Left message for patient to return call to Office needs virtual visit.

## 2019-01-14 ENCOUNTER — Ambulatory Visit (INDEPENDENT_AMBULATORY_CARE_PROVIDER_SITE_OTHER): Payer: BLUE CROSS/BLUE SHIELD | Admitting: Family

## 2019-01-14 ENCOUNTER — Encounter: Payer: Self-pay | Admitting: Family

## 2019-01-14 DIAGNOSIS — M79674 Pain in right toe(s): Secondary | ICD-10-CM | POA: Insufficient documentation

## 2019-01-14 DIAGNOSIS — M79675 Pain in left toe(s): Secondary | ICD-10-CM | POA: Diagnosis not present

## 2019-01-14 MED ORDER — NAPROXEN 500 MG PO TABS: 500.0000 mg | ORAL_TABLET | Freq: Two times a day (BID) | ORAL | 0 refills | Status: DC

## 2019-01-14 NOTE — Patient Instructions (Addendum)
Trial of naprosyn for one week.    I suspect this could be an overuse syndrome from the walking and/or possibly gout.     Please follow-up and certainly has any new or worsening symptoms   Foot Pain Many things can cause foot pain. Some common causes are:  An injury.  A sprain.  Arthritis.  Blisters.  Bunions. Follow these instructions at home: Pay attention to any changes in your symptoms. Take these actions to help with your discomfort:  If directed, put ice on the affected area: ? Put ice in a plastic bag. ? Place a towel between your skin and the bag. ? Leave the ice on for 15-20 minutes, 3?4 times a day for 2 days.  Take over-the-counter and prescription medicines only as told by your health care provider.  Wear comfortable, supportive shoes that fit you well. Do not wear high heels.  Do not stand or walk for long periods of time.  Do not lift a lot of weight. This can put added pressure on your feet.  Do stretches to relieve foot pain and stiffness as told by your health care provider.  Rub your foot gently.  Keep your feet clean and dry. Contact a health care provider if:  Your pain does not get better after a few days of self-care.  Your pain gets worse.  You cannot stand on your foot. Get help right away if:  Your foot is numb or tingling.  Your foot or toes are swollen.  Your foot or toes turn white or blue.  You have warmth and redness along your foot. This information is not intended to replace advice given to you by your health care provider. Make sure you discuss any questions you have with your health care provider. Document Released: 10/14/2015 Document Revised: 02/23/2016 Document Reviewed: 10/13/2014 Elsevier Interactive Patient Education  2019 [employer].

## 2019-01-14 NOTE — Assessment & Plan Note (Signed)
Pain is waxing and waning nature, and with the bilateral nature, I discussed with patient my strong suspicion for an overuse syndrome particularly with the amount of walking she is been doing of late.  Although in differential I still include gout.  We jointly agreed with the limitation telemedicine, we will treat empirically for both.  No signs of infection based on our call today and patient's description.  Will treat with naproxen, with close follow-up.  Patient let me know certainly if any worsening symptoms in between.

## 2019-01-14 NOTE — Progress Notes (Signed)
This visit type was conducted due to national recommendations for restrictions regarding the COVID-19 pandemic (e.g. social distancing).  This format is felt to be most appropriate for this patient at this time.  All issues noted in this document were discussed and addressed.  No physical exam was performed (except for noted visual exam findings with Video Visits). Virtual Visit via Video Note  I connected with@  on 01/14/19 at  1:15 PM EDT by a video enabled telemedicine application and verified that I am speaking with the correct person using two identifiers.  Location patient: home Location provider:work  Persons participating in the virtual visit: patient, provider  I discussed the limitations of evaluation and management by telemedicine and the availability of in person appointments. The patient expressed understanding and agreed to proceed.  Interactive audio and video telecommunications were attempted between this provider and patient, however failed, due to patient having technical difficulties or patient did not have access to video capability.  We initially could see and hear chatter however we lost connection and were not unable to do so.  We continued and completed visit with audio only.   HPI: Bilateral toe pain started to hurt several weeks ago, waxes and wanes. Describes redness and 'knot' on top of joint.  Pain with bending toes. No lacerations. No fever, N, V, numbness. No pain under foot.  Swelling and pain after walking for exercise. Notes walking 4 miles every day.  Notes had shrimp 2 weeks ago. Drinking cherry juice and avoiding meats.   No 'flare in 6-7 years.' H/o gout  ROS: See pertinent positives and negatives per HPI.  Past Medical History:  Diagnosis Date  . Allergy    Seasonal  . Anxiety   . Cancer ([place]) skin  . Depression   . Frequent headaches     Past Surgical History:  Procedure Laterality Date  . BACK SURGERY    . FOOT SURGERY Left   . TUBAL  LIGATION  2013    Family History  Problem Relation Age of Onset  . Breast cancer Paternal Grandmother   . Diabetes Paternal Grandmother   . Other Mother        accidental death  . Cancer Father        brain tumor  . Melanoma Father   . Lung cancer Father   . Alcohol abuse Brother   . Drug abuse Brother   . Anxiety disorder Brother   . Depression Brother     SOCIAL HX: never smoker   Current Outpatient Medications:  .  buPROPion (WELLBUTRIN XL) 150 MG 24 hr tablet, Start 150 mg ER PO qam, increase after 3 days to 300 mg qam., Disp: 60 tablet, Rfl: 3 .  EPINEPHrine (EPIPEN 2-PAK) 0.3 mg/0.3 mL IJ SOAJ injection, , Disp: , Rfl:  .  gabapentin (NEURONTIN) 300 MG capsule, gabapentin 300 mg capsule  Take 1 capsule(s) 3 times a day by oral route., Disp: , Rfl:  .  levocetirizine (XYZAL) 5 MG tablet, Take 1 tablet (5 mg total) by mouth daily., Disp: 90 tablet, Rfl: 1 .  montelukast (SINGULAIR) 10 MG tablet, Take 1 tablet (10 mg total) by mouth daily., Disp: 90 tablet, Rfl: 1 .  PROAIR RESPICLICK 160 (90 Base) MCG/ACT AEPB, , Disp: , Rfl: 0 .  Spacer/Aero-Holding Chambers (AEROCHAMBER PLUS) inhaler, Use as instructed, Disp: 1 each, Rfl: 2 .  zolpidem (AMBIEN CR) 6.25 MG CR tablet, Take 1 tablet (6.25 mg total) by mouth at bedtime as needed. for  sleep, Disp: 30 tablet, Rfl: 1 .  naproxen (NAPROSYN) 500 MG tablet, Take 1 tablet (500 mg total) by mouth 2 (two) times daily with a meal., Disp: 30 tablet, Rfl: 0   ASSESSMENT AND PLAN:  Discussed the following assessment and plan:  Toe pain, bilateral - Plan: naproxen (NAPROSYN) 500 MG tablet  Problem List Items Addressed This Visit      Other   Toe pain, bilateral - Primary    Pain is waxing and waning nature, and with the bilateral nature, I discussed with patient my strong suspicion for an overuse syndrome particularly with the amount of walking she is been doing of late.  Although in differential I still include gout.  We jointly  agreed with the limitation telemedicine, we will treat empirically for both.  No signs of infection based on our call today and patient's description.  Will treat with naproxen, with close follow-up.  Patient let me know certainly if any worsening symptoms in between.      Relevant Medications   naproxen (NAPROSYN) 500 MG tablet       I discussed the assessment and treatment plan with the patient. The patient was provided an opportunity to ask questions and all were answered. The patient agreed with the plan and demonstrated an understanding of the instructions.   The patient was advised to call back or seek an in-person evaluation if the symptoms worsen or if the condition fails to improve as anticipated.   Mable Paris, FNP

## 2019-01-15 NOTE — Telephone Encounter (Signed)
I spoke to the patient to inform her that I would send the coding change to patient accounting . She stated she would hold off on paying the bill for now. I called patient accounting they will send her account up for review.

## 2019-01-16 ENCOUNTER — Other Ambulatory Visit: Payer: Self-pay

## 2019-01-16 DIAGNOSIS — F5105 Insomnia due to other mental disorder: Principal | ICD-10-CM

## 2019-01-16 DIAGNOSIS — F418 Other specified anxiety disorders: Secondary | ICD-10-CM

## 2019-01-16 MED ORDER — ZOLPIDEM TARTRATE ER 6.25 MG PO TBCR: 6.2500 mg | EXTENDED_RELEASE_TABLET | Freq: Every evening | ORAL | 1 refills | Status: DC | PRN

## 2019-01-16 NOTE — Telephone Encounter (Signed)
I looked up patient on [place] Controlled Substances Reporting System and saw no activity that raised concern of inappropriate use.   

## 2019-01-21 ENCOUNTER — Ambulatory Visit (INDEPENDENT_AMBULATORY_CARE_PROVIDER_SITE_OTHER): Payer: BLUE CROSS/BLUE SHIELD | Admitting: Family

## 2019-01-21 ENCOUNTER — Encounter: Payer: Self-pay | Admitting: Family

## 2019-01-21 DIAGNOSIS — M79675 Pain in left toe(s): Secondary | ICD-10-CM | POA: Diagnosis not present

## 2019-01-21 DIAGNOSIS — M79674 Pain in right toe(s): Secondary | ICD-10-CM | POA: Diagnosis not present

## 2019-01-21 MED ORDER — PREDNISONE 10 MG PO TABS: ORAL_TABLET | ORAL | 0 refills | Status: DC

## 2019-01-21 NOTE — Progress Notes (Signed)
Printed & mailed.  

## 2019-01-21 NOTE — Patient Instructions (Signed)
Start prednisone tomorrow as it would likely interfere with your sleep today. Stay vigilant with any new or worsening symptoms.  Again, suspect likely overuse syndrome. If you do not feel rather quickly relief on the prednisone, I would advise a consult podiatry, and/or orthopedics.  Please let me know you are doing

## 2019-01-21 NOTE — Assessment & Plan Note (Signed)
Unchanged.  Patient was well-appearing over video, not septic in appearance.  Differential still includes an overuse syndrome however some consideration for gouty tophi however typically do not suspect these to be tender.  At this point think is reasonable to trial prednisone as no suspicion for infection at this time.  She will try this and stay very vigilant with her symptom, if no improvement we will pursue podiatry and/or orthopedics.

## 2019-01-21 NOTE — Progress Notes (Signed)
This visit type was conducted due to national recommendations for restrictions regarding the COVID-19 pandemic (e.g. social distancing).  This format is felt to be most appropriate for this patient at this time.  All issues noted in this document were discussed and addressed.  No physical exam was performed (except for noted visual exam findings with Video Visits). Virtual Visit via Video Note  I connected with@  on 01/21/19 at  1:30 PM EDT by a video enabled telemedicine application and verified that I am speaking with the correct person using two identifiers.  Location patient: home Location provider:work  Persons participating in the virtual visit: patient, provider  I discussed the limitations of evaluation and management by telemedicine and the availability of in person appointments. The patient expressed understanding and agreed to proceed.   HPI:   Chief complaint for subsequent appointment bilateral toe pain. Cannot bend great toes BL, not painful however uncomfortable.  Reports no significant improvement from naproxen.   Concern for "bone spurs". Not running as she had been, walking now 10miles per day, without much relief of toe pain.  Knot on BL great toe joint which is tender. Some improvement in swelling.   No fever, N, V, increased warmth of joints or lacerations.   No rash , purulent discharge from 'knots'.   Otherwise, feels well.    ROS: See pertinent positives and negatives per HPI.  Past Medical History:  Diagnosis Date  . Allergy    Seasonal  . Anxiety   . Cancer ([place]) skin  . Depression   . Frequent headaches     Past Surgical History:  Procedure Laterality Date  . BACK SURGERY  12/31/2018  . FOOT SURGERY Left   . TUBAL LIGATION  2013    Family History  Problem Relation Age of Onset  . Breast cancer Paternal Grandmother   . Diabetes Paternal Grandmother   . Other Mother        accidental death  . Cancer Father        brain tumor  . Melanoma  Father   . Lung cancer Father   . Alcohol abuse Brother   . Drug abuse Brother   . Anxiety disorder Brother   . Depression Brother     SOCIAL HX: never smoker   Current Outpatient Medications:  .  buPROPion (WELLBUTRIN XL) 150 MG 24 hr tablet, Start 150 mg ER PO qam, increase after 3 days to 300 mg qam., Disp: 60 tablet, Rfl: 3 .  EPINEPHrine (EPIPEN 2-PAK) 0.3 mg/0.3 mL IJ SOAJ injection, , Disp: , Rfl:  .  gabapentin (NEURONTIN) 300 MG capsule, gabapentin 300 mg capsule  Take 1 capsule(s) 3 times a day by oral route., Disp: , Rfl:  .  levocetirizine (XYZAL) 5 MG tablet, Take 1 tablet (5 mg total) by mouth daily., Disp: 90 tablet, Rfl: 1 .  montelukast (SINGULAIR) 10 MG tablet, Take 1 tablet (10 mg total) by mouth daily., Disp: 90 tablet, Rfl: 1 .  naproxen (NAPROSYN) 500 MG tablet, Take 1 tablet (500 mg total) by mouth 2 (two) times daily with a meal., Disp: 30 tablet, Rfl: 0 .  PROAIR RESPICLICK 673 (90 Base) MCG/ACT AEPB, , Disp: , Rfl: 0 .  Spacer/Aero-Holding Chambers (AEROCHAMBER PLUS) inhaler, Use as instructed, Disp: 1 each, Rfl: 2 .  zolpidem (AMBIEN CR) 6.25 MG CR tablet, Take 1 tablet (6.25 mg total) by mouth at bedtime as needed. for sleep, Disp: 30 tablet, Rfl: 1 .  predniSONE (DELTASONE) 10 MG  tablet, Take 40 mg by mouth on day 1, then taper 10 mg daily until gone, Disp: 10 tablet, Rfl: 0  EXAM:  VITALS per patient if applicable:  GENERAL: alert, oriented, appears well and in no acute distress  HEENT: atraumatic, conjunttiva clear, no obvious abnormalities on inspection of external nose and ears  NECK: normal movements of the head and neck  LUNGS: on inspection no signs of respiratory distress, breathing rate appears normal, no obvious gross SOB, gasping or wheezing  CV: no obvious cyanosis  MS: moves all visible extremities without noticeable abnormality.  Patient able to pan down with video and showed me bilateral great toes.  I can appreciate 1 to 2 cm appearing  cyst like mass on medial to ventral side of bilateral great toes.  It did not appear hyper-erythematous.  No streaking appreciated.  It also did not appear fluctuant.  PSYCH/NEURO: pleasant and cooperative, no obvious depression or anxiety, speech and thought processing grossly intact  ASSESSMENT AND PLAN:  Discussed the following assessment and plan:  Toe pain, bilateral - Plan: predniSONE (DELTASONE) 10 MG tablet  Problem List Items Addressed This Visit      Other   Toe pain, bilateral - Primary    Unchanged.  Patient was well-appearing over video, not septic in appearance.  Differential still includes an overuse syndrome however some consideration for gouty tophi however typically do not suspect these to be tender.  At this point think is reasonable to trial prednisone as no suspicion for infection at this time.  She will try this and stay very vigilant with her symptom, if no improvement we will pursue podiatry and/or orthopedics.      Relevant Medications   predniSONE (DELTASONE) 10 MG tablet        I discussed the assessment and treatment plan with the patient. The patient was provided an opportunity to ask questions and all were answered. The patient agreed with the plan and demonstrated an understanding of the instructions.   The patient was advised to call back or seek an in-person evaluation if the symptoms worsen or if the condition fails to improve as anticipated.   Mable Paris, FNP

## 2019-02-06 IMAGING — MG MM DIGITAL SCREENING BILAT W/ TOMO W/ CAD
8 series · 8 of 24 positions shown · non-contrast
Comparison: Previous exam(s).

CLINICAL DATA: Screening.

EXAM:
DIGITAL SCREENING BILATERAL MAMMOGRAM WITH TOMO AND CAD

[L MLO synth-2D]
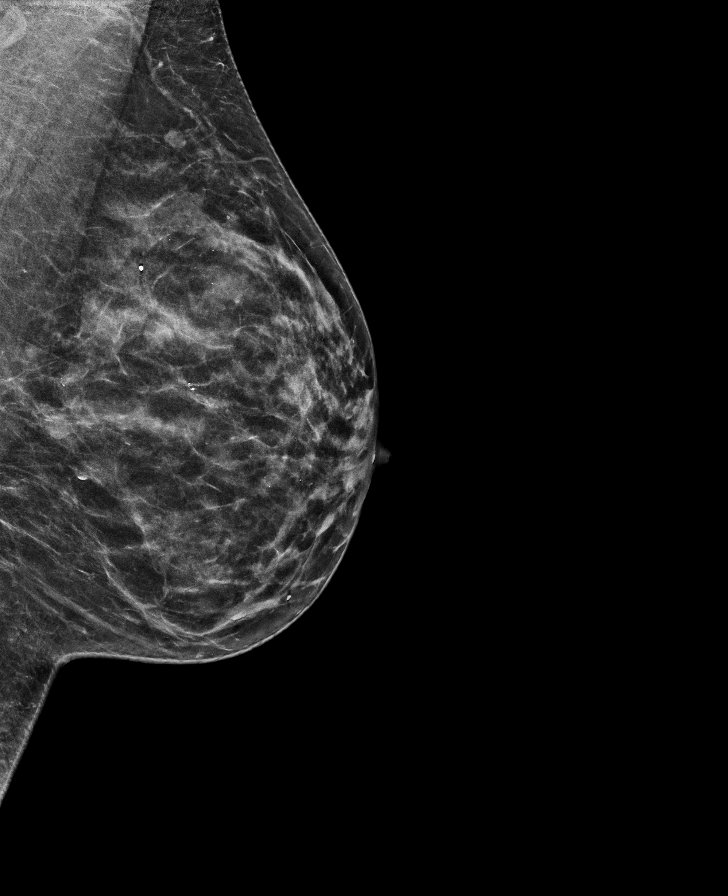

[R CC synth-2D]
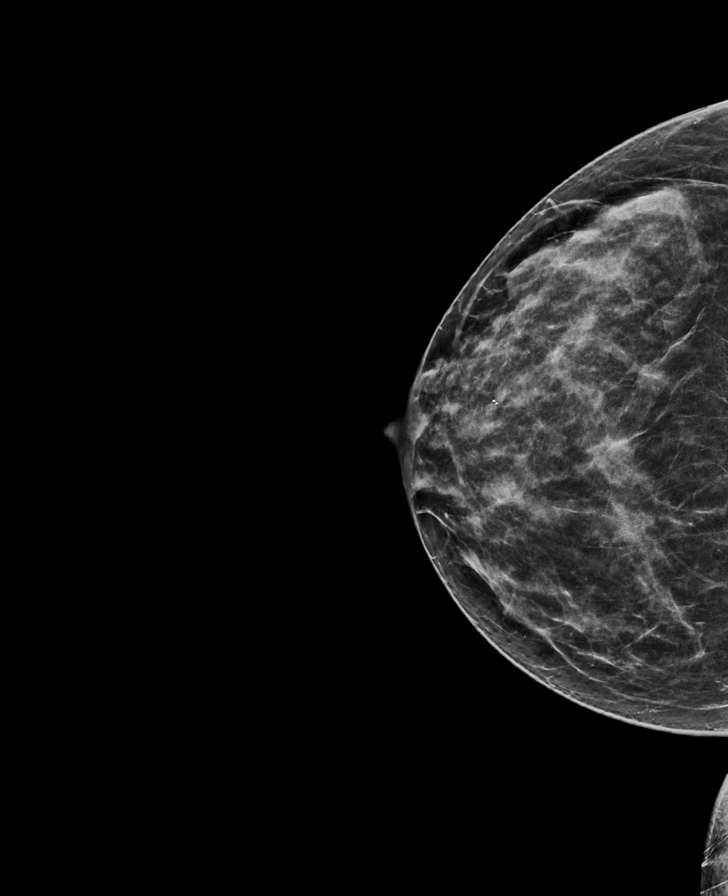

[R MLO synth-2D]
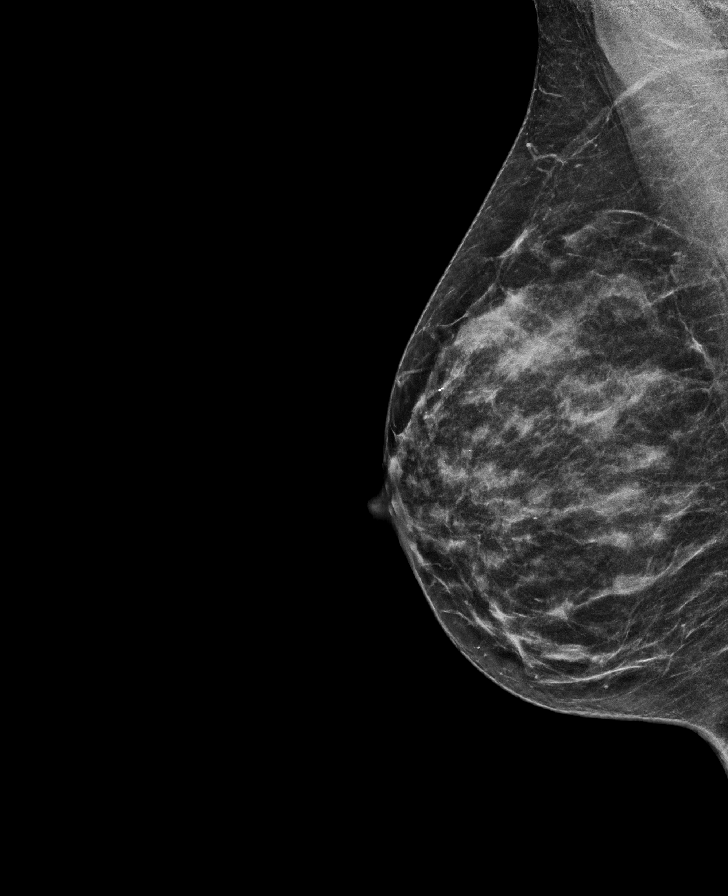

[L CC synth-2D]
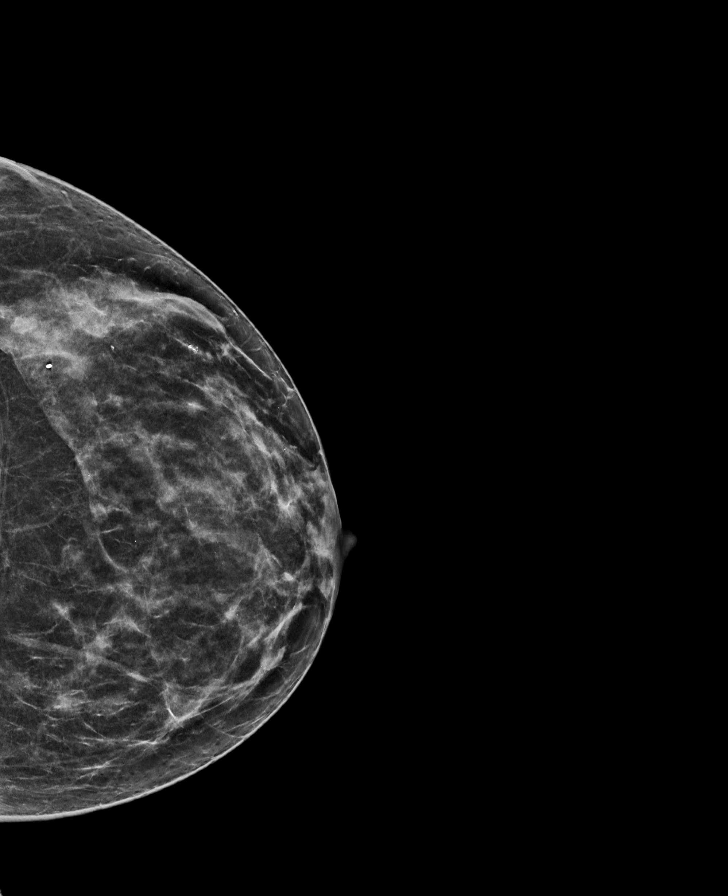

[R CC tomo · tomo slice 29/56.0]
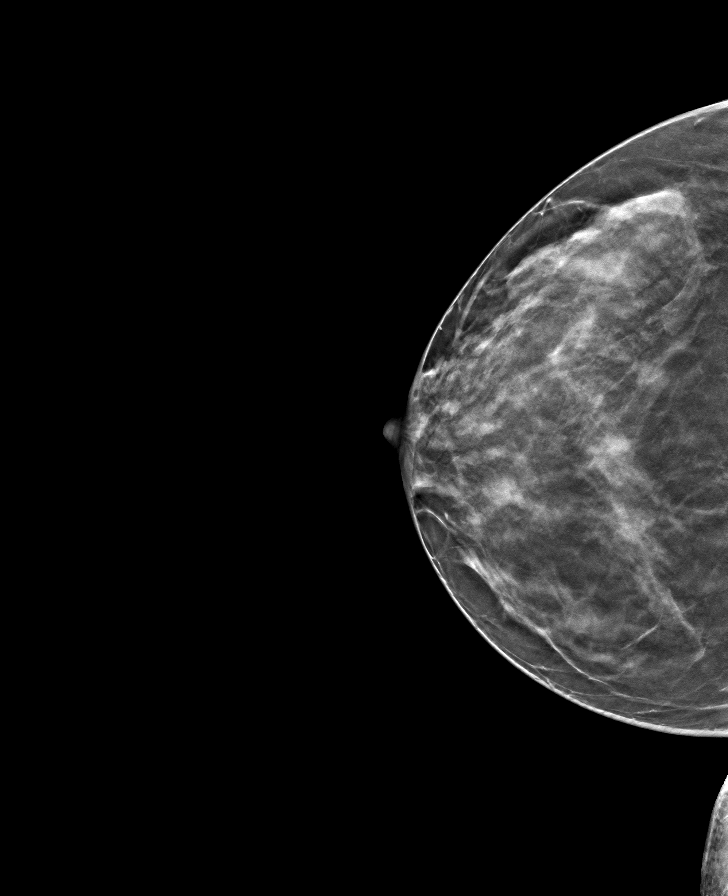

[L MLO tomo · tomo slice 29/57.0]
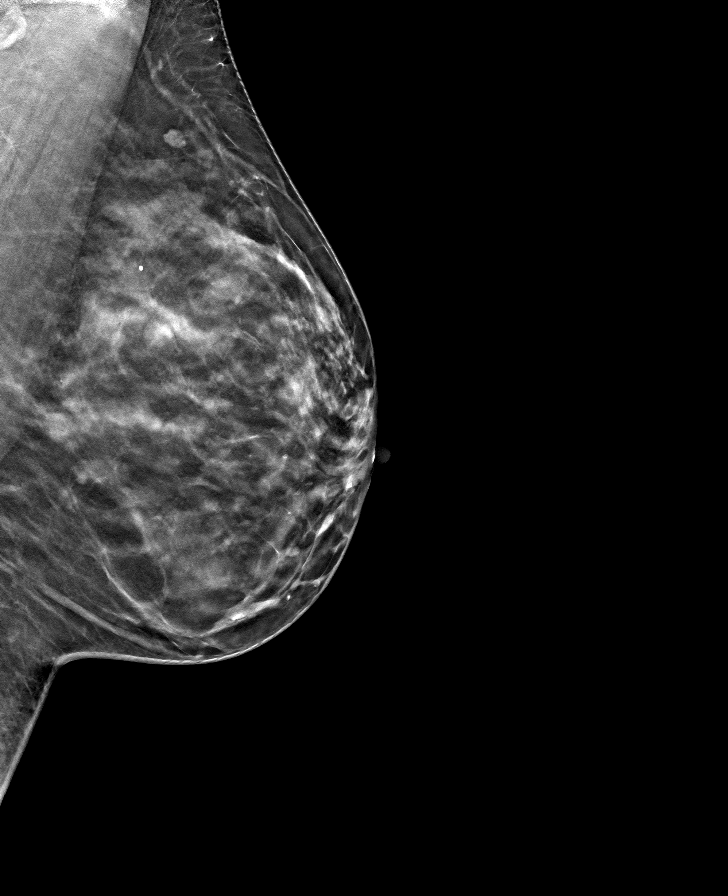

[R MLO tomo · tomo slice 29/56.0]
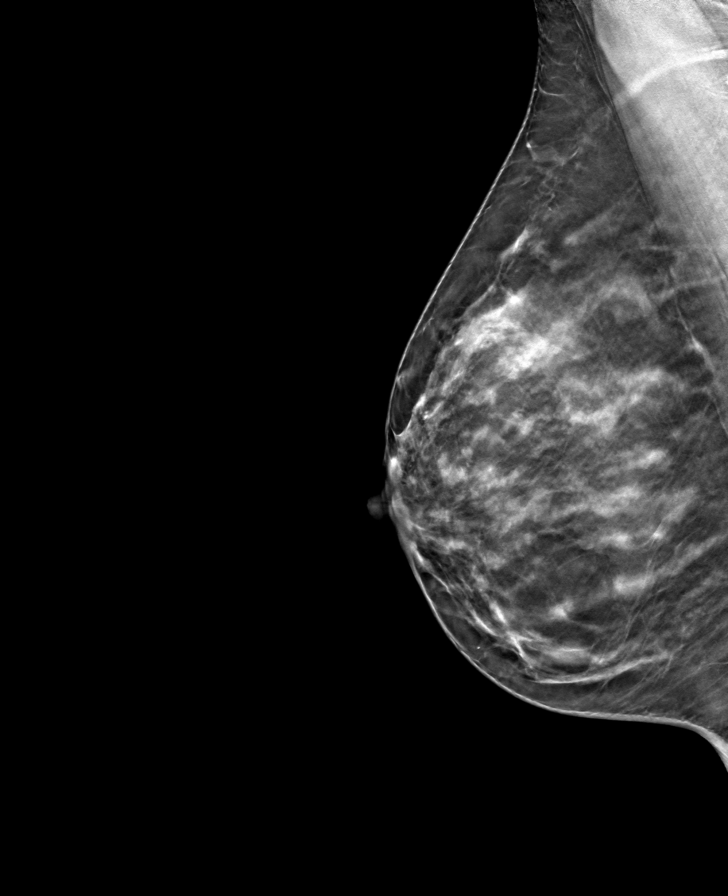

[L CC tomo · tomo slice 29/58.0]
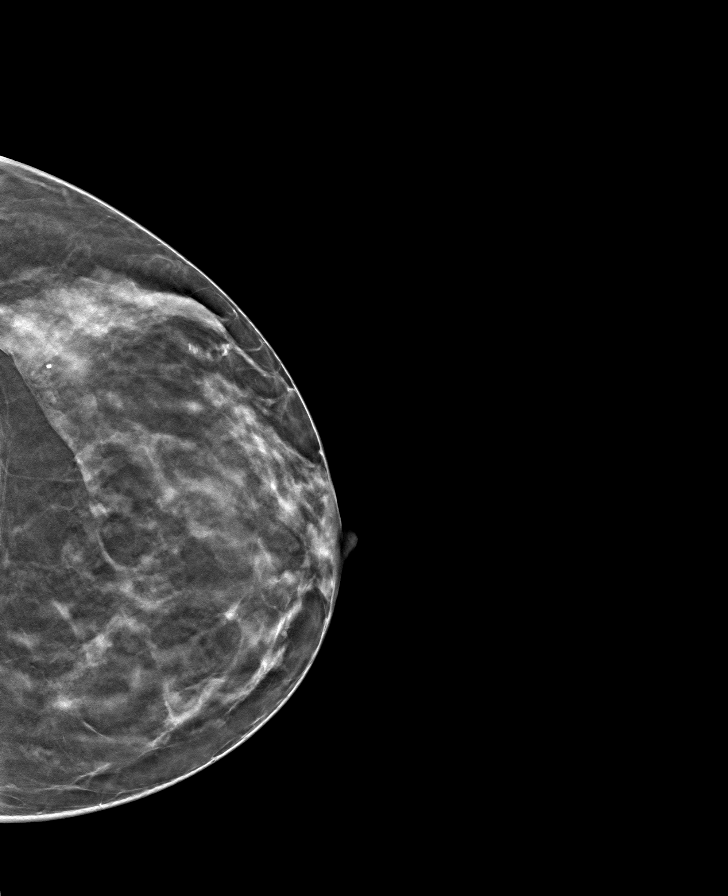

[8 of 24 positions shown; findings below may reference images not displayed]

ACR Breast Density Category c: The breast tissue is heterogeneously
dense, which may obscure small masses.
FINDINGS: In the right breast, a possible asymmetry warrants further
evaluation. In the left breast, no findings suspicious for
malignancy. Images were processed with CAD.
IMPRESSION: Further evaluation is suggested for possible asymmetry in the right
breast.

RECOMMENDATION:
Diagnostic mammogram and possibly ultrasound of the right breast.
(Code:EU-[id number])

The patient will be contacted regarding the findings, and additional
imaging will be scheduled.

BI-RADS CATEGORY  0: Incomplete. Need additional imaging evaluation
and/or prior mammograms for comparison.

## 2019-02-27 ENCOUNTER — Other Ambulatory Visit: Payer: Self-pay | Admitting: Family

## 2019-02-27 DIAGNOSIS — J309 Allergic rhinitis, unspecified: Secondary | ICD-10-CM

## 2019-03-30 DIAGNOSIS — M79671 Pain in right foot: Secondary | ICD-10-CM | POA: Diagnosis not present

## 2019-03-30 DIAGNOSIS — M2021 Hallux rigidus, right foot: Secondary | ICD-10-CM | POA: Diagnosis not present

## 2019-03-30 DIAGNOSIS — M2022 Hallux rigidus, left foot: Secondary | ICD-10-CM | POA: Diagnosis not present

## 2019-03-30 DIAGNOSIS — M79672 Pain in left foot: Secondary | ICD-10-CM | POA: Diagnosis not present

## 2019-04-08 ENCOUNTER — Other Ambulatory Visit: Payer: Self-pay

## 2019-04-08 DIAGNOSIS — F418 Other specified anxiety disorders: Secondary | ICD-10-CM

## 2019-04-08 DIAGNOSIS — F5105 Insomnia due to other mental disorder: Secondary | ICD-10-CM

## 2019-04-08 DIAGNOSIS — F411 Generalized anxiety disorder: Secondary | ICD-10-CM

## 2019-04-08 MED ORDER — BUPROPION HCL ER (XL) 150 MG PO TB24: ORAL_TABLET | ORAL | 3 refills | Status: DC

## 2019-04-08 MED ORDER — ZOLPIDEM TARTRATE ER 6.25 MG PO TBCR: 6.2500 mg | EXTENDED_RELEASE_TABLET | Freq: Every evening | ORAL | 1 refills | Status: DC | PRN

## 2019-04-08 NOTE — Telephone Encounter (Signed)
I looked up patient on [place] Controlled Substances Reporting System and saw no activity that raised concern of inappropriate use.   

## 2019-05-30 ENCOUNTER — Encounter: Payer: Self-pay | Admitting: Emergency Medicine

## 2019-05-30 ENCOUNTER — Ambulatory Visit
Admission: EM | Admit: 2019-05-30 | Discharge: 2019-05-30 | Disposition: A | Discharge status: Home / Self Care | Payer: BC Managed Care – PPO | Attending: Emergency Medicine | Admitting: Emergency Medicine

## 2019-05-30 ENCOUNTER — Other Ambulatory Visit: Payer: Self-pay

## 2019-05-30 DIAGNOSIS — B029 Zoster without complications: Secondary | ICD-10-CM

## 2019-05-30 MED ORDER — VALACYCLOVIR HCL 1 G PO TABS: 1000.0000 mg | ORAL_TABLET | Freq: Three times a day (TID) | ORAL | 0 refills | Status: DC

## 2019-05-30 NOTE — Discharge Instructions (Addendum)
You were diagnosed with shingles. Take the medication as prescribed until it is complete.   Take ibuprofen 800mg  (four 200mg  pills) three times a day with food to help with pain.   Make sure you wear sunscreen when you do to the beach next weekend.

## 2019-05-30 NOTE — ED Triage Notes (Signed)
Patient c/o tingling and painful rash on her left upper back since yesterday.  Patient denies fevers.  Patient does report some bodyaches.

## 2019-05-30 NOTE — ED Provider Notes (Signed)
[facility] - [place], [place]   Name: Tracey [name] DOB: March 28, 1971 MRN: [id number] CSN: [id number] PCP: Burnard Hawthorne, FNP  Arrival date and time:  05/30/19 [phone]  Chief Complaint:  Rash   NOTE: Prior to seeing the patient today, I have reviewed the triage nursing documentation and vital signs. Clinical staff has updated patient's PMH/PSHx, current medication list, and drug allergies/intolerances to ensure comprehensive history available to assist in medical decision making.   History:   HPI: Tracey [name] is a 48 y.o. female who presents today with complaints of a painful rash and body aches. Pt has never had anything like this before. She states she started "feeling bad" 2-3 days ago with fatigue and body aches, similar to the flu. She noticed pain to her side yesterday and the rash erupted last night. She states she has been under an increased amount of stress recently (a change in relationship status and work stressors).  She has chickenpox as a child, but no history of shingles. She denies any fever or chills.   Past Medical History:  Diagnosis Date  . Allergy    Seasonal  . Anxiety   . Cancer ([place]) skin  . Depression   . Frequent headaches     Past Surgical History:  Procedure Laterality Date  . BACK SURGERY  12/31/2018  . FOOT SURGERY Left   . TUBAL LIGATION  2013    Family History  Problem Relation Age of Onset  . Breast cancer Paternal Grandmother   . Diabetes Paternal Grandmother   . Other Mother        accidental death  . Cancer Father        brain tumor  . Melanoma Father   . Lung cancer Father   . Alcohol abuse Brother   . Drug abuse Brother   . Anxiety disorder Brother   . Depression Brother     Social History   Tobacco Use  . Smoking status: Never Smoker  . Smokeless tobacco: Never Used  Substance Use Topics  . Alcohol use: Not Currently    Alcohol/week: 4.0 - 8.0 standard drinks    Types: 2 - 6 Glasses of wine, 2  Shots of liquor per week  . Drug use: No    Patient Active Problem List   Diagnosis Date Noted  . Toe pain, bilateral 01/14/2019  . GAD (generalized anxiety disorder) 06/25/2018  . Allergic rhinitis 06/24/2017  . Concentration deficit 02/14/2017  . Encounter to establish care 08/28/2015  . Frequent headaches 08/28/2015  . History of endometrial ablation 08/28/2015  . Insomnia secondary to depression with anxiety 08/28/2015    Home Medications:    Current Meds  Medication Sig  . buPROPion (WELLBUTRIN XL) 150 MG 24 hr tablet Start 150 mg ER PO qam, increase after 3 days to 300 mg qam.  . levocetirizine (XYZAL) 5 MG tablet TAKE 1 TABLET DAILY.  . montelukast (SINGULAIR) 10 MG tablet TAKE 1 TABLET DAILY.  Marland Kitchen PROAIR RESPICLICK 123XX123 (90 Base) MCG/ACT AEPB     Allergies:   Molds & smuts  Review of Systems (ROS): Review of Systems  Constitutional: Positive for fatigue. Negative for chills and fever.  Skin: Positive for rash.  All other systems reviewed and are negative.    Vital Signs: Today's Vitals   05/30/19 0855 05/30/19 0859 05/30/19 0923  BP:  121/85   Pulse:  71   Resp:  14   Temp:  98.2 F (36.8 C)  TempSrc:  Oral   SpO2:  100%   Weight: 130 lb (59 kg)    Height: 5\' 2"  (1.575 m)    PainSc: 4   4     Physical Exam: Physical Exam Vitals signs and nursing note reviewed.  Constitutional:      General: She is not in acute distress.    Appearance: She is well-developed.  HENT:     Head: Normocephalic and atraumatic.  Eyes:     Conjunctiva/sclera: Conjunctivae normal.  Neck:     Musculoskeletal: Neck supple.  Cardiovascular:     Rate and Rhythm: Normal rate and regular rhythm.     Heart sounds: No murmur.  Pulmonary:     Effort: Pulmonary effort is normal. No respiratory distress.     Breath sounds: Normal breath sounds.  Abdominal:     Palpations: Abdomen is soft.     Tenderness: There is no abdominal tenderness.  Skin:    General: Skin is warm and  dry.     Findings: Rash present. Rash is papular and vesicular.       Neurological:     Mental Status: She is alert.     Urgent Care Treatments / Results:   LABS: PLEASE NOTE: all labs that were ordered this encounter are listed, however only abnormal results are displayed. Labs Reviewed - No data to display  EKG: -None  RADIOLOGY: No results found.  PROCEDURES: Procedures  MEDICATIONS RECEIVED THIS VISIT: Medications - No data to display  PERTINENT CLINICAL COURSE NOTES/UPDATES:   Initial Impression / Assessment and Plan / Urgent Care Course:  Pertinent labs & imaging results that were available during my care of the patient were personally reviewed by me and considered in my medical decision making (see lab/imaging section of note for values and interpretations).  Tracey [name] is a 48 y.o. female who presents to [facility] today with complaints of fatigue and rash, diagnosed with shingles, and treated as such with the medications below. NP and patient reviewed discharge instructions below during visit.   Patient is well appearing overall in clinic today. She does not appear to be in any acute distress. Presenting symptoms (see HPI) and exam as documented above.   I have reviewed the follow up and strict return precautions for any new or worsening symptoms. Patient is aware of symptoms that would be deemed urgent/emergent, and would thus require further evaluation either here or in the emergency department. At the time of discharge, she verbalized understanding and consent with the discharge plan as it was reviewed with her. All questions were fielded by provider and/or clinic staff prior to patient discharge.    Final Clinical Impressions / Urgent Care Diagnoses:   Final diagnoses:  Herpes zoster without complication    New Prescriptions:  [place] Controlled Substance Registry consulted? Not Applicable  Meds ordered this encounter  Medications  .  valACYclovir (VALTREX) 1000 MG tablet    Sig: Take 1 tablet (1,000 mg total) by mouth 3 (three) times daily.    Dispense:  21 tablet    Refill:  0      Discharge Instructions     You were diagnosed with shingles. Take the medication as prescribed until it is complete.   Take ibuprofen 800mg  (four 200mg  pills) three times a day with food to help with pain.   Make sure you wear sunscreen when you do to the beach next weekend.     Recommended Follow up Care:  Patient encouraged  to follow up with the following provider within the specified time frame, or sooner as dictated by the severity of her symptoms. As always, she was instructed that for any urgent/emergent care needs, she should seek care either here or in the emergency department for more immediate evaluation.   Gertie Baron, DNP, NP-c    Gertie Baron, NP 05/30/19 [phone]

## 2019-06-03 ENCOUNTER — Other Ambulatory Visit: Payer: Self-pay | Admitting: Obstetrics and Gynecology

## 2019-06-03 DIAGNOSIS — Z1231 Encounter for screening mammogram for malignant neoplasm of breast: Secondary | ICD-10-CM

## 2019-06-05 ENCOUNTER — Other Ambulatory Visit: Payer: Self-pay

## 2019-06-05 DIAGNOSIS — F418 Other specified anxiety disorders: Secondary | ICD-10-CM

## 2019-06-09 MED ORDER — ZOLPIDEM TARTRATE ER 6.25 MG PO TBCR: 6.2500 mg | EXTENDED_RELEASE_TABLET | Freq: Every evening | ORAL | 0 refills | Status: DC | PRN

## 2019-06-09 NOTE — Telephone Encounter (Signed)
Call pt   I have refilled your ambien for ONE month  However I wanted to remind you that this is controlled substance.   In order for me to prescribe medication,  patients must be seen every 3 months.   Please make follow-up appointment this month for any further refills.    I looked up patient on [place] Controlled Substances Reporting System and saw no activity that raised concern of inappropriate use.

## 2019-06-15 ENCOUNTER — Ambulatory Visit (INDEPENDENT_AMBULATORY_CARE_PROVIDER_SITE_OTHER): Payer: BC Managed Care – PPO | Admitting: Family

## 2019-06-15 ENCOUNTER — Encounter: Payer: Self-pay | Admitting: Family

## 2019-06-15 VITALS — Ht 62.0 in | Wt 132.0 lb

## 2019-06-15 DIAGNOSIS — F411 Generalized anxiety disorder: Secondary | ICD-10-CM | POA: Diagnosis not present

## 2019-06-15 MED ORDER — BUSPIRONE HCL 7.5 MG PO TABS: 7.5000 mg | ORAL_TABLET | Freq: Two times a day (BID) | ORAL | 2 refills | Status: AC

## 2019-06-15 NOTE — Progress Notes (Signed)
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   Feels stressed; working out is helpful and she does this 6 days per week. Feels like wellbutrin 'is fine' and not caused by medication.   Would like referral to counselor.  Not sleeping well; hasnt taken ambien in 4 days as has not been in town to pick it up.  Recent shingles to left back; resolved now.      History, background, results pertinent:  Never smoker.   A/P/next steps: Problem List Items Addressed This Visit      Other   GAD (generalized anxiety disorder) - Primary    Worsening of late.  Depression screen [facility] 2/9 06/15/2019 06/24/2017 02/14/2017  Decreased Interest 2 0 0  Down, Depressed, Hopeless 1 0 0  PHQ - 2 Score 3 0 0  Altered sleeping 3 - -  Tired, decreased energy 2 - -  Change in appetite 0 - -  Feeling bad or failure about yourself  1 - -  Trouble concentrating 3 - -  Moving slowly or fidgety/restless 3 - -  Suicidal thoughts 0 - -  PHQ-9 Score 15 - -  Difficult doing work/chores Somewhat difficult - -   Trial of buspar however patient would like to use more prn. If not effective, we discussed atarax for prn. Explained that anxiety more likely better controlled with Buspar scheduled BID. Referral to counseling. She verbalized understanding of all.        Relevant Medications   busPIRone (BUSPAR) 7.5 MG tablet   Other Relevant Orders   Ambulatory referral to Psychology       I spent 15 min  discussing plan of care over the phone.

## 2019-06-15 NOTE — Assessment & Plan Note (Addendum)
Worsening of late.  Depression screen [facility] 2/9 06/15/2019 06/24/2017 02/14/2017  Decreased Interest 2 0 0  Down, Depressed, Hopeless 1 0 0  PHQ - 2 Score 3 0 0  Altered sleeping 3 - -  Tired, decreased energy 2 - -  Change in appetite 0 - -  Feeling bad or failure about yourself  1 - -  Trouble concentrating 3 - -  Moving slowly or fidgety/restless 3 - -  Suicidal thoughts 0 - -  PHQ-9 Score 15 - -  Difficult doing work/chores Somewhat difficult - -   Trial of buspar however patient would like to use more prn. If not effective, we discussed atarax for prn. Explained that anxiety more likely better controlled with Buspar scheduled BID. Referral to counseling. She verbalized understanding of all.

## 2019-06-15 NOTE — Progress Notes (Signed)
Printed and mailed

## 2019-06-15 NOTE — Patient Instructions (Addendum)
Trial of buspar   Anxiety likely better controlled with Buspar scheduled twice daily however you may trial taking as needed and see if effective for you. If ineffective, we can trial another medication atarax to be taken as needed.  Today we discussed referrals, orders. Counseling Miguel Dibble)   I have placed these orders in the system for you.  Please be sure to give [place] a call if you have not heard from our office regarding this. We should hear from [place] within ONE week with information regarding your appointment. If not, please let me know immediately.    Stay safe!   Generalized Anxiety Disorder, Adult Generalized anxiety disorder (GAD) is a mental health disorder. People with this condition constantly worry about everyday events. Unlike normal anxiety, worry related to GAD is not triggered by a specific event. These worries also do not fade or get better with time. GAD interferes with life functions, including relationships, work, and school. GAD can vary from mild to severe. People with severe GAD can have intense waves of anxiety with physical symptoms (panic attacks). What are the causes? The exact cause of GAD is not known. What increases the risk? This condition is more likely to develop in:  Women.  People who have a family history of anxiety disorders.  People who are very shy.  People who experience very stressful life events, such as the death of a loved one.  People who have a very stressful family environment. What are the signs or symptoms? People with GAD often worry excessively about many things in their lives, such as their health and family. They may also be overly concerned about:  Doing well at work.  Being on time.  Natural disasters.  Friendships. Physical symptoms of GAD include:  Fatigue.  Muscle tension or having muscle twitches.  Trembling or feeling shaky.  Being easily startled.  Feeling like your heart is pounding or racing.  Feeling  out of breath or like you cannot take a deep breath.  Having trouble falling asleep or staying asleep.  Sweating.  Nausea, diarrhea, or irritable bowel syndrome (IBS).  Headaches.  Trouble concentrating or remembering facts.  Restlessness.  Irritability. How is this diagnosed? Your health care provider can diagnose GAD based on your symptoms and medical history. You will also have a physical exam. The health care provider will ask specific questions about your symptoms, including how severe they are, when they started, and if they come and go. Your health care provider may ask you about your use of alcohol or drugs, including prescription medicines. Your health care provider may refer you to a mental health specialist for further evaluation. Your health care provider will do a thorough examination and may perform additional tests to rule out other possible causes of your symptoms. To be diagnosed with GAD, a person must have anxiety that:  Is out of his or her control.  Affects several different aspects of his or her life, such as work and relationships.  Causes distress that makes him or her unable to take part in normal activities.  Includes at least three physical symptoms of GAD, such as restlessness, fatigue, trouble concentrating, irritability, muscle tension, or sleep problems. Before your health care provider can confirm a diagnosis of GAD, these symptoms must be present more days than they are not, and they must last for six months or longer. How is this treated? The following therapies are usually used to treat GAD:  Medicine. Antidepressant medicine is usually  prescribed for long-term daily control. Antianxiety medicines may be added in severe cases, especially when panic attacks occur.  Talk therapy (psychotherapy). Certain types of talk therapy can be helpful in treating GAD by providing support, education, and guidance. Options include: ? Cognitive behavioral therapy  (CBT). People learn coping skills and techniques to ease their anxiety. They learn to identify unrealistic or negative thoughts and behaviors and to replace them with positive ones. ? Acceptance and commitment therapy (ACT). This treatment teaches people how to be mindful as a way to cope with unwanted thoughts and feelings. ? Biofeedback. This process trains you to manage your body's response (physiological response) through breathing techniques and relaxation methods. You will work with a therapist while machines are used to monitor your physical symptoms.  Stress management techniques. These include yoga, meditation, and exercise. A mental health specialist can help determine which treatment is best for you. Some people see improvement with one type of therapy. However, other people require a combination of therapies. Follow these instructions at home:  Take over-the-counter and prescription medicines only as told by your health care provider.  Try to maintain a normal routine.  Try to anticipate stressful situations and allow extra time to manage them.  Practice any stress management or self-calming techniques as taught by your health care provider.  Do not punish yourself for setbacks or for not making progress.  Try to recognize your accomplishments, even if they are small.  Keep all follow-up visits as told by your health care provider. This is important. Contact a health care provider if:  Your symptoms do not get better.  Your symptoms get worse.  You have signs of depression, such as: ? A persistently sad, cranky, or irritable mood. ? Loss of enjoyment in activities that used to bring you joy. ? Change in weight or eating. ? Changes in sleeping habits. ? Avoiding friends or family members. ? Loss of energy for normal tasks. ? Feelings of guilt or worthlessness. Get help right away if:  You have serious thoughts about hurting yourself or others. If you ever feel like  you may hurt yourself or others, or have thoughts about taking your own life, get help right away. You can go to your nearest emergency department or call:  Your local emergency services (911 in the [place]  A suicide crisis helpline, such as the [facility] at 936-727-7537. This is open 24 hours a day. Summary  Generalized anxiety disorder (GAD) is a mental health disorder that involves worry that is not triggered by a specific event.  People with GAD often worry excessively about many things in their lives, such as their health and family.  GAD may cause physical symptoms such as restlessness, trouble concentrating, sleep problems, frequent sweating, nausea, diarrhea, headaches, and trembling or muscle twitching.  A mental health specialist can help determine which treatment is best for you. Some people see improvement with one type of therapy. However, other people require a combination of therapies. This information is not intended to replace advice given to you by your health care provider. Make sure you discuss any questions you have with your health care provider. Document Released: 01/12/2013 Document Revised: 08/30/2017 Document Reviewed: 08/07/2016 Elsevier Patient Education  2020 [employer].

## 2019-06-30 ENCOUNTER — Telehealth: Payer: Self-pay

## 2019-06-30 NOTE — Telephone Encounter (Signed)
Copied from [place] [id number]. Topic: Referral - Status >> Jun 30, 2019 10:56 AM Percell Belt A wrote: Reason for CRM:  pt called in to check status of her referral to Miguel Dibble.   Best number [phone]

## 2019-06-30 NOTE — Telephone Encounter (Signed)
I called patient & made her aware that we have resubmitted referral. If she does not get a call within 7 day to call us back.

## 2019-06-30 NOTE — Telephone Encounter (Signed)
No, I will send it over to [facility]. Thanks!

## 2019-07-21 ENCOUNTER — Ambulatory Visit
Admission: RE | Admit: 2019-07-21 | Discharge: 2019-07-21 | Disposition: A | Discharge status: Home / Self Care | Payer: BC Managed Care – PPO | Source: Ambulatory Visit | Attending: Obstetrics and Gynecology | Admitting: Obstetrics and Gynecology

## 2019-07-21 DIAGNOSIS — Z1231 Encounter for screening mammogram for malignant neoplasm of breast: Secondary | ICD-10-CM | POA: Diagnosis not present

## 2019-07-21 DIAGNOSIS — F419 Anxiety disorder, unspecified: Secondary | ICD-10-CM | POA: Diagnosis not present

## 2019-07-21 DIAGNOSIS — N920 Excessive and frequent menstruation with regular cycle: Secondary | ICD-10-CM | POA: Diagnosis not present

## 2019-07-21 DIAGNOSIS — Z01419 Encounter for gynecological examination (general) (routine) without abnormal findings: Secondary | ICD-10-CM | POA: Diagnosis not present

## 2019-08-11 DIAGNOSIS — M2021 Hallux rigidus, right foot: Secondary | ICD-10-CM | POA: Diagnosis not present

## 2019-08-11 DIAGNOSIS — M79671 Pain in right foot: Secondary | ICD-10-CM | POA: Diagnosis not present

## 2019-08-11 DIAGNOSIS — M2022 Hallux rigidus, left foot: Secondary | ICD-10-CM | POA: Diagnosis not present

## 2019-08-18 DIAGNOSIS — M10071 Idiopathic gout, right ankle and foot: Secondary | ICD-10-CM | POA: Diagnosis not present

## 2019-08-18 DIAGNOSIS — M2022 Hallux rigidus, left foot: Secondary | ICD-10-CM | POA: Diagnosis not present

## 2019-08-18 DIAGNOSIS — M2021 Hallux rigidus, right foot: Secondary | ICD-10-CM | POA: Diagnosis not present

## 2019-08-18 DIAGNOSIS — M79671 Pain in right foot: Secondary | ICD-10-CM | POA: Diagnosis not present

## 2019-08-19 DIAGNOSIS — F331 Major depressive disorder, recurrent, moderate: Secondary | ICD-10-CM | POA: Diagnosis not present

## 2019-08-19 DIAGNOSIS — F411 Generalized anxiety disorder: Secondary | ICD-10-CM | POA: Diagnosis not present

## 2019-09-02 DIAGNOSIS — Z20828 Contact with and (suspected) exposure to other viral communicable diseases: Secondary | ICD-10-CM | POA: Diagnosis not present

## 2019-09-05 DIAGNOSIS — F331 Major depressive disorder, recurrent, moderate: Secondary | ICD-10-CM | POA: Diagnosis not present

## 2019-09-05 DIAGNOSIS — F411 Generalized anxiety disorder: Secondary | ICD-10-CM | POA: Diagnosis not present

## 2019-09-17 ENCOUNTER — Telehealth: Payer: Self-pay | Admitting: Family

## 2019-09-17 ENCOUNTER — Other Ambulatory Visit: Payer: Self-pay | Admitting: Family

## 2019-09-17 DIAGNOSIS — J309 Allergic rhinitis, unspecified: Secondary | ICD-10-CM

## 2019-09-17 DIAGNOSIS — F5105 Insomnia due to other mental disorder: Secondary | ICD-10-CM

## 2019-09-17 DIAGNOSIS — F418 Other specified anxiety disorders: Secondary | ICD-10-CM

## 2019-09-17 MED ORDER — ZOLPIDEM TARTRATE ER 6.25 MG PO TBCR: 6.2500 mg | EXTENDED_RELEASE_TABLET | Freq: Every evening | ORAL | 1 refills | Status: DC | PRN

## 2019-09-17 NOTE — Telephone Encounter (Signed)
I called patient left detailed VM for patient that I was sorry Tracey [name] was out of the office today & that she would refill tomorrow as soon as she was back. I also advised if she had requested prior to today we had not received the request bc Tracey [name] had covering providers her whole maternity leave.

## 2019-09-17 NOTE — Telephone Encounter (Signed)
Call  Pt  Please apologize as im not sure why we didn't get requests while on leave- my colleagues would have filled. Please advise her to always ask 7 days early for refills to ensure we get request   I looked up patient on [place] Controlled Substances Reporting System and saw no activity that raised concern of inappropriate use.

## 2019-09-17 NOTE — Telephone Encounter (Signed)
Pt said that when she saw [facility] before going out for her baby she would fill patinet's zolpidem (AMBIEN CR) 6.25 MG CR tablet. [facility] did not fill and patient is out off medication.

## 2019-09-17 NOTE — Telephone Encounter (Signed)
Pt called back and said she is frustrated. She hasn't been able to sleep and this is the second time she has went to the pharmacy and her prescription is not there. I informed her that Mable Paris was out of the office today and that she should wait until she hear back from [place] before proceeding to the pharmacy. She said ok and we ended the call. Please give patient a call as soon as it sent to the pharmacy. Thanks.

## 2019-09-18 ENCOUNTER — Other Ambulatory Visit: Payer: Self-pay

## 2019-09-18 DIAGNOSIS — F411 Generalized anxiety disorder: Secondary | ICD-10-CM

## 2019-09-18 MED ORDER — BUPROPION HCL ER (XL) 150 MG PO TB24: ORAL_TABLET | ORAL | 3 refills | Status: DC

## 2019-09-18 MED ORDER — BUSPIRONE HCL 7.5 MG PO TABS: 7.5000 mg | ORAL_TABLET | Freq: Two times a day (BID) | ORAL | 2 refills | Status: AC

## 2019-10-07 DIAGNOSIS — F411 Generalized anxiety disorder: Secondary | ICD-10-CM | POA: Diagnosis not present

## 2019-10-07 DIAGNOSIS — F331 Major depressive disorder, recurrent, moderate: Secondary | ICD-10-CM | POA: Diagnosis not present

## 2019-10-23 ENCOUNTER — Ambulatory Visit: Payer: BC Managed Care – PPO | Admitting: Family

## 2019-11-04 ENCOUNTER — Other Ambulatory Visit: Payer: Self-pay

## 2019-11-04 DIAGNOSIS — J309 Allergic rhinitis, unspecified: Secondary | ICD-10-CM

## 2019-11-04 MED ORDER — MONTELUKAST SODIUM 10 MG PO TABS: 10.0000 mg | ORAL_TABLET | Freq: Every day | ORAL | 0 refills | Status: DC

## 2019-11-05 ENCOUNTER — Other Ambulatory Visit: Payer: Self-pay | Admitting: Family

## 2019-11-05 DIAGNOSIS — F5105 Insomnia due to other mental disorder: Secondary | ICD-10-CM

## 2019-11-05 DIAGNOSIS — F418 Other specified anxiety disorders: Secondary | ICD-10-CM

## 2019-11-06 NOTE — Telephone Encounter (Signed)
Call pt   I have refilled your Lorrin Mais to [facility] drug store  However I wanted to remind you that this is controlled substance.   In order for me to prescribe medication,  patients must be seen every 3 months.   Please make follow-up appointment this month for any further refills.    I looked up patient on [place] Controlled Substances Reporting System and saw no activity that raised concern of inappropriate use.

## 2019-11-12 NOTE — Telephone Encounter (Signed)
Follow up scheduled

## 2019-12-15 ENCOUNTER — Encounter: Payer: Self-pay | Admitting: Family

## 2019-12-15 ENCOUNTER — Ambulatory Visit: Payer: BC Managed Care – PPO | Admitting: Family

## 2019-12-15 ENCOUNTER — Other Ambulatory Visit: Payer: Self-pay

## 2019-12-15 ENCOUNTER — Telehealth: Payer: Self-pay | Admitting: Family

## 2019-12-15 VITALS — BP 102/78 | HR 80 | Temp 97.0°F | Ht 61.25 in | Wt 133.6 lb

## 2019-12-15 DIAGNOSIS — M545 Low back pain, unspecified: Secondary | ICD-10-CM

## 2019-12-15 DIAGNOSIS — F411 Generalized anxiety disorder: Secondary | ICD-10-CM | POA: Diagnosis not present

## 2019-12-15 DIAGNOSIS — F5105 Insomnia due to other mental disorder: Secondary | ICD-10-CM | POA: Diagnosis not present

## 2019-12-15 DIAGNOSIS — G8929 Other chronic pain: Secondary | ICD-10-CM

## 2019-12-15 DIAGNOSIS — F418 Other specified anxiety disorders: Secondary | ICD-10-CM | POA: Diagnosis not present

## 2019-12-15 MED ORDER — GABAPENTIN 300 MG PO CAPS: 300.0000 mg | ORAL_CAPSULE | Freq: Every day | ORAL | 3 refills | Status: DC

## 2019-12-15 MED ORDER — BUSPIRONE HCL 7.5 MG PO TABS: 7.5000 mg | ORAL_TABLET | Freq: Two times a day (BID) | ORAL | 2 refills | Status: AC | PRN

## 2019-12-15 NOTE — Progress Notes (Signed)
Subjective:    Patient ID: Tracey [name], female    DOB: 05-09-1971, 49 y.o.   MRN: [id number]  CC: Tracey [name] is a 49 y.o. female who presents today for follow up.   HPI: Feels well today No new complaints  Anxiety- doing well. 'anxiety is down'. Seeing Miguel Dibble which has been helpful. Taking wellbutrin 150mg .   Chronic low back pain- improved. Excercising would like for me to take over gabapentin from Dr Joselyn Arrow. Has upcoming imaging and 2 year check up.   Insomnia- doing well on ambien.   BL toe pain- following with Vickki Muff for this, receiving  cortisone shots.   Follows with [facility]. States pap smear is UTD   HISTORY:  Past Medical History:  Diagnosis Date  . Allergy    Seasonal  . Anxiety   . Cancer ([place]) skin  . Depression   . Frequent headaches    Past Surgical History:  Procedure Laterality Date  . BACK SURGERY  12/31/2018   Dr Dimmig; rod and screw  . FOOT SURGERY Left   . TUBAL LIGATION  2013   Family History  Problem Relation Age of Onset  . Breast cancer Paternal Grandmother   . Diabetes Paternal Grandmother   . Other Mother        accidental death  . Cancer Father        brain tumor  . Melanoma Father   . Lung cancer Father   . Alcohol abuse Brother   . Drug abuse Brother   . Anxiety disorder Brother   . Depression Brother     Allergies: Molds & smuts Current Outpatient Medications on File Prior to Visit  Medication Sig Dispense Refill  . buPROPion (WELLBUTRIN XL) 150 MG 24 hr tablet Start 150 mg ER PO qam, increase after 3 days to 300 mg qam. 60 tablet 3  . levocetirizine (XYZAL) 5 MG tablet TAKE 1 TABLET DAILY. 90 tablet 0  . montelukast (SINGULAIR) 10 MG tablet Take 1 tablet (10 mg total) by mouth daily. 90 tablet 0  . PROAIR RESPICLICK 123XX123 (90 Base) MCG/ACT AEPB   0  . zolpidem (AMBIEN CR) 6.25 MG CR tablet TAKE (1) TABLET BY MOUTH EVERY NIGHT AT BEDTIME AS NEEDED FOR SLEEP 30 tablet 1   No current  facility-administered medications on file prior to visit.    Social History   Tobacco Use  . Smoking status: Never Smoker  . Smokeless tobacco: Never Used  Substance Use Topics  . Alcohol use: Not Currently    Alcohol/week: 4.0 - 8.0 standard drinks    Types: 2 - 6 Glasses of wine, 2 Shots of liquor per week  . Drug use: No    Review of Systems  Constitutional: Negative for chills and fever.  Respiratory: Negative for cough.   Cardiovascular: Negative for chest pain and palpitations.  Gastrointestinal: Negative for nausea and vomiting.  Musculoskeletal: Positive for arthralgias (BL toes) and back pain.      Objective:    BP 102/78   Pulse 80   Temp (!) 97 F (36.1 C) (Temporal)   Ht 5' 1.25" (1.556 m)   Wt 133 lb 9.6 oz (60.6 kg)   SpO2 99%   BMI 25.04 kg/m  BP Readings from Last 3 Encounters:  12/15/19 102/78  05/30/19 121/85  12/18/18 123/84   Wt Readings from Last 3 Encounters:  12/15/19 133 lb 9.6 oz (60.6 kg)  06/15/19 132 lb (59.9 kg)  05/30/19 130 lb (  59 kg)    Physical Exam Vitals reviewed.  Constitutional:      Appearance: She is well-developed.  Eyes:     Conjunctiva/sclera: Conjunctivae normal.  Cardiovascular:     Rate and Rhythm: Normal rate and regular rhythm.     Pulses: Normal pulses.     Heart sounds: Normal heart sounds.  Pulmonary:     Effort: Pulmonary effort is normal.     Breath sounds: Normal breath sounds. No wheezing, rhonchi or rales.  Skin:    General: Skin is warm and dry.  Neurological:     Mental Status: She is alert.  Psychiatric:        Speech: Speech normal.        Behavior: Behavior normal.        Thought Content: Thought content normal.        Assessment & Plan:   Problem List Items Addressed This Visit      Other   GAD (generalized anxiety disorder) - Primary    Doing well on wellbutrin, prn buspar. Will continue.       Relevant Medications   busPIRone (BUSPAR) 7.5 MG tablet   Insomnia secondary to  depression with anxiety    Stable. Ambien is helpful, we will continue. Pending drug urine screen today      Relevant Medications   busPIRone (BUSPAR) 7.5 MG tablet   Low back pain    Chronic, stable. Will assume prescription for gabapentin.       Relevant Medications   gabapentin (NEURONTIN) 300 MG capsule   Other Relevant Orders   Pain Management Screening Profile (10S)       I have discontinued Clent Ridges. Prothero's EPINEPHrine and gabapentin. I am also having her start on busPIRone and gabapentin. Additionally, I am having her maintain her ProAir RespiClick, levocetirizine, buPROPion, montelukast, and zolpidem.   Meds ordered this encounter  Medications  . busPIRone (BUSPAR) 7.5 MG tablet    Sig: Take 1 tablet (7.5 mg total) by mouth 2 (two) times daily as needed.    Dispense:  60 tablet    Refill:  2    Order Specific Question:   Supervising Provider    Answer:   Deborra Medina L [[id number]]  . gabapentin (NEURONTIN) 300 MG capsule    Sig: Take 1 capsule (300 mg total) by mouth at bedtime.    Dispense:  90 capsule    Refill:  3    Order Specific Question:   Supervising Provider    Answer:   Crecencio Mc [[id number]]    Return precautions given.   Risks, benefits, and alternatives of the medications and treatment plan prescribed today were discussed, and patient expressed understanding.   Education regarding symptom management and diagnosis given to patient on AVS.  Continue to follow with Burnard Hawthorne, FNP for routine health maintenance.   [place] and I agreed with plan.   Mable Paris, FNP

## 2019-12-15 NOTE — Telephone Encounter (Signed)
Left patient a message to schedule 3 month follow up

## 2019-12-15 NOTE — Assessment & Plan Note (Addendum)
Doing well on wellbutrin, prn buspar. Will continue.

## 2019-12-15 NOTE — Assessment & Plan Note (Signed)
Chronic, stable. Will assume prescription for gabapentin.

## 2019-12-15 NOTE — Assessment & Plan Note (Signed)
Stable. Ambien is helpful, we will continue. Pending drug urine screen today

## 2019-12-15 NOTE — Patient Instructions (Signed)
Nice to see you!   

## 2019-12-16 LAB — PMP SCREEN PROFILE (10S), URINE
Amphetamine Scrn, Ur: NEGATIVE ng/mL
BARBITURATE SCREEN URINE: NEGATIVE ng/mL
BENZODIAZEPINE SCREEN, URINE: NEGATIVE ng/mL
CANNABINOIDS UR QL SCN: NEGATIVE ng/mL
Cocaine (Metab) Scrn, Ur: NEGATIVE ng/mL
Creatinine(Crt), U: 41.5 mg/dL (ref 20.0–300.0)
Methadone Screen, Urine: NEGATIVE ng/mL
OXYCODONE+OXYMORPHONE UR QL SCN: NEGATIVE ng/mL
Opiate Scrn, Ur: NEGATIVE ng/mL
Ph of Urine: 5.6 (ref 4.5–8.9)
Phencyclidine Qn, Ur: NEGATIVE ng/mL
Propoxyphene Scrn, Ur: NEGATIVE ng/mL

## 2020-01-29 ENCOUNTER — Other Ambulatory Visit: Payer: Self-pay | Admitting: Family

## 2020-01-29 DIAGNOSIS — J309 Allergic rhinitis, unspecified: Secondary | ICD-10-CM

## 2020-02-12 DIAGNOSIS — Z03818 Encounter for observation for suspected exposure to other biological agents ruled out: Secondary | ICD-10-CM | POA: Diagnosis not present

## 2020-02-12 DIAGNOSIS — Z20828 Contact with and (suspected) exposure to other viral communicable diseases: Secondary | ICD-10-CM | POA: Diagnosis not present

## 2020-03-14 ENCOUNTER — Other Ambulatory Visit: Payer: Self-pay | Admitting: Family

## 2020-03-14 ENCOUNTER — Telehealth: Payer: Self-pay | Admitting: Family

## 2020-03-14 DIAGNOSIS — F418 Other specified anxiety disorders: Secondary | ICD-10-CM

## 2020-03-14 DIAGNOSIS — F5105 Insomnia due to other mental disorder: Secondary | ICD-10-CM

## 2020-03-14 DIAGNOSIS — J309 Allergic rhinitis, unspecified: Secondary | ICD-10-CM

## 2020-03-14 MED ORDER — ZOLPIDEM TARTRATE ER 6.25 MG PO TBCR: EXTENDED_RELEASE_TABLET | ORAL | 1 refills | Status: DC

## 2020-03-14 NOTE — Telephone Encounter (Signed)
Refill request for Tracey [name], last seen 12-15-19, last filled 11-06-19.  Please advise.

## 2020-03-14 NOTE — Telephone Encounter (Signed)
Patient needs her zolpidem (AMBIEN CR) 6.25 MG CR tablet. Patient has a virtual appt set up for this Wednesday, 03/16/20.

## 2020-03-14 NOTE — Telephone Encounter (Signed)
I looked up patient on [place] Controlled Substances Reporting System PMP AWARE and saw no activity that raised concern of inappropriate use.   

## 2020-03-16 ENCOUNTER — Other Ambulatory Visit: Payer: Self-pay

## 2020-03-16 ENCOUNTER — Telehealth (INDEPENDENT_AMBULATORY_CARE_PROVIDER_SITE_OTHER): Payer: BC Managed Care – PPO | Admitting: Family

## 2020-03-16 DIAGNOSIS — F418 Other specified anxiety disorders: Secondary | ICD-10-CM

## 2020-03-16 DIAGNOSIS — F5105 Insomnia due to other mental disorder: Secondary | ICD-10-CM

## 2020-03-16 DIAGNOSIS — F411 Generalized anxiety disorder: Secondary | ICD-10-CM | POA: Diagnosis not present

## 2020-03-16 MED ORDER — HYDROXYZINE HCL 10 MG PO TABS: 10.0000 mg | ORAL_TABLET | Freq: Two times a day (BID) | ORAL | 0 refills | Status: DC | PRN

## 2020-03-16 MED ORDER — BUPROPION HCL ER (XL) 300 MG PO TB24: 300.0000 mg | ORAL_TABLET | Freq: Every day | ORAL | 1 refills | Status: DC

## 2020-03-16 NOTE — Progress Notes (Signed)
Virtual Visit via Video Note  I connected with@  on 03/16/20 at  4:00 PM EDT by a video enabled telemedicine application and verified that I am speaking with the correct person using two identifiers.  Location patient: home Location provider:work Persons participating in the virtual visit: patient, provider  I discussed the limitations of evaluation and management by telemedicine and the availability of in person appointments. The patient expressed understanding and agreed to proceed.   HPI: Feels well today.  Anxiety- stable. Every once and while has increased anxiety, around menses. Would like a medication for this. Buspar wasn't covered by insurance.   Tried atarax in the past.   Doing well on [place] and doesn't take most weekends. Using prn. No falls or having weird dreams.  Takes gabapentin prn for back pain and doesn't feel that it makes her too sedated.   Due colonoscopy. Declines colonoscopy order at this time.   ROS: See pertinent positives and negatives per HPI.  Past Medical History:  Diagnosis Date  . Allergy    Seasonal  . Anxiety   . Cancer ([place]) skin  . Depression   . Frequent headaches     Past Surgical History:  Procedure Laterality Date  . BACK SURGERY  12/31/2018   Dr Dimmig; rod and screw  . FOOT SURGERY Left   . TUBAL LIGATION  2013    Family History  Problem Relation Age of Onset  . Breast cancer Paternal Grandmother   . Diabetes Paternal Grandmother   . Other Mother        accidental death  . Cancer Father        brain tumor  . Melanoma Father   . Lung cancer Father   . Alcohol abuse Brother   . Drug abuse Brother   . Anxiety disorder Brother   . Depression Brother        Current Outpatient Medications:  .  buPROPion (WELLBUTRIN XL) 300 MG 24 hr tablet, Take 1 tablet (300 mg total) by mouth daily., Disp: 90 tablet, Rfl: 1 .  gabapentin (NEURONTIN) 300 MG capsule, Take 1 capsule (300 mg total) by mouth at bedtime., Disp: 90 capsule,  Rfl: 3 .  levocetirizine (XYZAL) 5 MG tablet, TAKE (1) TABLET BY MOUTH EVERY DAY, Disp: 90 tablet, Rfl: 0 .  montelukast (SINGULAIR) 10 MG tablet, Take 1 tablet (10 mg total) by mouth daily., Disp: 90 tablet, Rfl: 0 .  PROAIR RESPICLICK 097 (90 Base) MCG/ACT AEPB, , Disp: , Rfl: 0 .  zolpidem (AMBIEN CR) 6.25 MG CR tablet, TAKE (1) TABLET BY MOUTH EVERY NIGHT AT BEDTIME AS NEEDED FOR SLEEP, Disp: 30 tablet, Rfl: 1 .  hydrOXYzine (ATARAX/VISTARIL) 10 MG tablet, Take 1 tablet (10 mg total) by mouth 2 (two) times daily as needed for anxiety., Disp: 60 tablet, Rfl: 0  EXAM:  VITALS per patient if applicable:  GENERAL: alert, oriented, appears well and in no acute distress  HEENT: atraumatic, conjunttiva clear, no obvious abnormalities on inspection of external nose and ears  NECK: normal movements of the head and neck  LUNGS: on inspection no signs of respiratory distress, breathing rate appears normal, no obvious gross SOB, gasping or wheezing  CV: no obvious cyanosis  MS: moves all visible extremities without noticeable abnormality  PSYCH/NEURO: pleasant and cooperative, no obvious depression or anxiety, speech and thought processing grossly intact  ASSESSMENT AND PLAN:  Discussed the following assessment and plan:  GAD (generalized anxiety disorder) - Plan: hydrOXYzine (ATARAX/VISTARIL) 10 MG tablet,  buPROPion (WELLBUTRIN XL) 300 MG 24 hr tablet  Insomnia secondary to depression with anxiety Problem List Items Addressed This Visit      Other   GAD (generalized anxiety disorder) - Primary    Overall all doing well on wellbutrin, will continue.  For breakthrough anxiety, given as needed Atarax.  Patient will let me know how she is doing.  Close follow-up      Relevant Medications   hydrOXYzine (ATARAX/VISTARIL) 10 MG tablet   buPROPion (WELLBUTRIN XL) 300 MG 24 hr tablet   Insomnia secondary to depression with anxiety    Doing well on prn Ambien, will continue      Relevant  Medications   hydrOXYzine (ATARAX/VISTARIL) 10 MG tablet   buPROPion (WELLBUTRIN XL) 300 MG 24 hr tablet      -we discussed possible serious and likely etiologies, options for evaluation and workup, limitations of telemedicine visit vs in person visit, treatment, treatment risks and precautions. Pt prefers to treat via telemedicine empirically rather then risking or undertaking an in person visit at this moment. Patient agrees to seek prompt in person care if worsening, new symptoms arise, or if is not improving with treatment.   I discussed the assessment and treatment plan with the patient. The patient was provided an opportunity to ask questions and all were answered. The patient agreed with the plan and demonstrated an understanding of the instructions.   The patient was advised to call back or seek an in-person evaluation if the symptoms worsen or if the condition fails to improve as anticipated.   Mable Paris, FNP

## 2020-03-16 NOTE — Assessment & Plan Note (Signed)
Doing well on prn Ambien, will continue

## 2020-03-16 NOTE — Assessment & Plan Note (Signed)
Overall all doing well on wellbutrin, will continue.  For breakthrough anxiety, given as needed Atarax.  Patient will let me know how she is doing.  Close follow-up

## 2020-04-19 DIAGNOSIS — R61 Generalized hyperhidrosis: Secondary | ICD-10-CM | POA: Diagnosis not present

## 2020-04-19 DIAGNOSIS — N951 Menopausal and female climacteric states: Secondary | ICD-10-CM | POA: Diagnosis not present

## 2020-04-19 DIAGNOSIS — F3281 Premenstrual dysphoric disorder: Secondary | ICD-10-CM | POA: Diagnosis not present

## 2020-04-19 DIAGNOSIS — N924 Excessive bleeding in the premenopausal period: Secondary | ICD-10-CM | POA: Diagnosis not present

## 2020-04-25 ENCOUNTER — Other Ambulatory Visit: Payer: Self-pay | Admitting: Family

## 2020-04-25 DIAGNOSIS — J309 Allergic rhinitis, unspecified: Secondary | ICD-10-CM

## 2020-05-12 ENCOUNTER — Other Ambulatory Visit: Payer: Self-pay | Admitting: Family

## 2020-05-12 DIAGNOSIS — F418 Other specified anxiety disorders: Secondary | ICD-10-CM

## 2020-05-30 ENCOUNTER — Other Ambulatory Visit: Payer: Self-pay | Admitting: Family

## 2020-05-30 DIAGNOSIS — F411 Generalized anxiety disorder: Secondary | ICD-10-CM

## 2020-06-29 DIAGNOSIS — S63632A Sprain of interphalangeal joint of right middle finger, initial encounter: Secondary | ICD-10-CM | POA: Diagnosis not present

## 2020-07-11 ENCOUNTER — Other Ambulatory Visit: Payer: Self-pay | Admitting: Family

## 2020-07-11 DIAGNOSIS — F411 Generalized anxiety disorder: Secondary | ICD-10-CM

## 2020-08-08 ENCOUNTER — Other Ambulatory Visit: Payer: Self-pay | Admitting: Obstetrics and Gynecology

## 2020-08-08 DIAGNOSIS — Z1231 Encounter for screening mammogram for malignant neoplasm of breast: Secondary | ICD-10-CM

## 2020-08-10 ENCOUNTER — Other Ambulatory Visit: Payer: Self-pay | Admitting: Family

## 2020-08-10 DIAGNOSIS — J309 Allergic rhinitis, unspecified: Secondary | ICD-10-CM

## 2020-08-10 DIAGNOSIS — F411 Generalized anxiety disorder: Secondary | ICD-10-CM

## 2020-09-06 DIAGNOSIS — R102 Pelvic and perineal pain: Secondary | ICD-10-CM | POA: Diagnosis not present

## 2020-09-06 DIAGNOSIS — Z1231 Encounter for screening mammogram for malignant neoplasm of breast: Secondary | ICD-10-CM | POA: Diagnosis not present

## 2020-09-06 DIAGNOSIS — R5383 Other fatigue: Secondary | ICD-10-CM | POA: Diagnosis not present

## 2020-09-06 DIAGNOSIS — Z113 Encounter for screening for infections with a predominantly sexual mode of transmission: Secondary | ICD-10-CM | POA: Diagnosis not present

## 2020-09-06 DIAGNOSIS — Z01419 Encounter for gynecological examination (general) (routine) without abnormal findings: Secondary | ICD-10-CM | POA: Diagnosis not present

## 2020-09-06 DIAGNOSIS — Z13220 Encounter for screening for lipoid disorders: Secondary | ICD-10-CM | POA: Diagnosis not present

## 2020-09-06 DIAGNOSIS — Z0000 Encounter for general adult medical examination without abnormal findings: Secondary | ICD-10-CM | POA: Diagnosis not present

## 2020-09-06 DIAGNOSIS — N949 Unspecified condition associated with female genital organs and menstrual cycle: Secondary | ICD-10-CM | POA: Diagnosis not present

## 2020-10-05 ENCOUNTER — Other Ambulatory Visit: Payer: Self-pay | Admitting: Family

## 2020-10-06 ENCOUNTER — Telehealth: Payer: Self-pay

## 2020-10-06 DIAGNOSIS — F418 Other specified anxiety disorders: Secondary | ICD-10-CM

## 2020-10-07 MED ORDER — ZOLPIDEM TARTRATE ER 6.25 MG PO TBCR: 6.2500 mg | EXTENDED_RELEASE_TABLET | Freq: Every evening | ORAL | 0 refills | Status: DC | PRN

## 2020-10-07 NOTE — Telephone Encounter (Signed)
Call pt   I have refilled your ambien  However I wanted to remind you that this is controlled substance.   In order for me to prescribe medication,  patients must be seen every 3 months.   Please make follow-up appointment this month for any further refills.    I looked up patient on [place] Controlled Substances Reporting System and saw no activity that raised concern of inappropriate use.    

## 2020-10-07 NOTE — Telephone Encounter (Signed)
Pt scheduled for f/u 11/09/20.

## 2020-11-09 ENCOUNTER — Encounter: Payer: Self-pay | Admitting: Family

## 2020-11-09 ENCOUNTER — Other Ambulatory Visit: Payer: Self-pay

## 2020-11-09 ENCOUNTER — Ambulatory Visit: Payer: BC Managed Care – PPO | Admitting: Family

## 2020-11-09 VITALS — BP 100/76 | HR 92 | Temp 98.5°F | Ht 61.26 in | Wt 136.8 lb

## 2020-11-09 DIAGNOSIS — F411 Generalized anxiety disorder: Secondary | ICD-10-CM

## 2020-11-09 DIAGNOSIS — J309 Allergic rhinitis, unspecified: Secondary | ICD-10-CM

## 2020-11-09 DIAGNOSIS — G4700 Insomnia, unspecified: Secondary | ICD-10-CM

## 2020-11-09 MED ORDER — BUSPIRONE HCL 7.5 MG PO TABS: 7.5000 mg | ORAL_TABLET | Freq: Two times a day (BID) | ORAL | 2 refills | Status: DC | PRN

## 2020-11-09 MED ORDER — TRAZODONE HCL 50 MG PO TABS: 25.0000 mg | ORAL_TABLET | Freq: Every evening | ORAL | 3 refills | Status: DC | PRN

## 2020-11-09 MED ORDER — LEVOCETIRIZINE DIHYDROCHLORIDE 5 MG PO TABS: ORAL_TABLET | ORAL | 0 refills | Status: DC

## 2020-11-09 NOTE — Progress Notes (Signed)
Subjective:    Patient ID: Tracey [name], female    DOB: Jul 25, 1971, 50 y.o.   MRN: [id number]  CC: Tracey [name] is a 50 y.o. female who presents today for follow up.   HPI: Feels well today.   GAD- she stopped taking wellbutrin 300mg   3 weeks ago and 'feels even better.'    She didn't think atarax was helpful. Would like medication to take to take around her menses when she feels more anxiety. She would like medication to be as needed versus daily as she doesn't feel anxious daily. Denies depression.   Insomnia- she is no longer taking ambien as felt it was causing her to be more awake. Taking Nyquil tablets to help with sleep.   Follows with Dr Dalbert Garnet and has recently started OCP    HISTORY:  Past Medical History:  Diagnosis Date  . Allergy    Seasonal  . Anxiety   . Cancer (HCC) skin  . Depression   . Frequent headaches    Past Surgical History:  Procedure Laterality Date  . BACK SURGERY  12/31/2018   Dr Dimmig; rod and screw  . FOOT SURGERY Left   . TUBAL LIGATION  2013   Family History  Problem Relation Age of Onset  . Breast cancer Paternal Grandmother   . Diabetes Paternal Grandmother   . Other Mother        accidental death  . Cancer Father        brain tumor  . Melanoma Father   . Lung cancer Father   . Alcohol abuse Brother   . Drug abuse Brother   . Anxiety disorder Brother   . Depression Brother     Allergies: Molds & smuts Current Outpatient Medications on File Prior to Visit  Medication Sig Dispense Refill  . gabapentin (NEURONTIN) 300 MG capsule Take 1 capsule (300 mg total) by mouth at bedtime. 90 capsule 3  . montelukast (SINGULAIR) 10 MG tablet TAKE (1) TABLET BY MOUTH DAILY. 90 tablet 0  . norgestimate-ethinyl estradiol (ORTHO-CYCLEN) 0.25-35 MG-MCG tablet Take 1 tablet by mouth daily.    Marland Kitchen PROAIR RESPICLICK 108 (90 Base) MCG/ACT AEPB   0   No current facility-administered medications on file prior to visit.    Social  History   Tobacco Use  . Smoking status: Never Smoker  . Smokeless tobacco: Never Used  Vaping Use  . Vaping Use: Never used  Substance Use Topics  . Alcohol use: Not Currently    Alcohol/week: 4.0 - 8.0 standard drinks    Types: 2 - 6 Glasses of wine, 2 Shots of liquor per week  . Drug use: No    Review of Systems  Constitutional: Negative for chills and fever.  Respiratory: Negative for cough.   Cardiovascular: Negative for chest pain and palpitations.  Gastrointestinal: Negative for nausea and vomiting.  Psychiatric/Behavioral: Positive for sleep disturbance. The patient is nervous/anxious.       Objective:    BP 100/76 (BP Location: Left Arm, Patient Position: Sitting)   Pulse 92   Temp 98.5 F (36.9 C)   Ht 5' 1.26" (1.556 m)   Wt 136 lb 12.8 oz (62.1 kg)   SpO2 98%   BMI 25.63 kg/m  BP Readings from Last 3 Encounters:  11/09/20 100/76  12/15/19 102/78  05/30/19 121/85   Wt Readings from Last 3 Encounters:  11/09/20 136 lb 12.8 oz (62.1 kg)  12/15/19 133 lb 9.6 oz (60.6 kg)  06/15/19 132  lb (59.9 kg)    Physical Exam Vitals reviewed.  Constitutional:      Appearance: She is well-developed and well-nourished.  Eyes:     Conjunctiva/sclera: Conjunctivae normal.  Cardiovascular:     Rate and Rhythm: Normal rate and regular rhythm.     Pulses: Normal pulses.     Heart sounds: Normal heart sounds.  Pulmonary:     Effort: Pulmonary effort is normal.     Breath sounds: Normal breath sounds. No wheezing, rhonchi or rales.  Skin:    General: Skin is warm and dry.  Neurological:     Mental Status: She is alert.  Psychiatric:        Mood and Affect: Mood and affect normal.        Speech: Speech normal.        Behavior: Behavior normal.        Thought Content: Thought content normal.        Assessment & Plan:   Problem List Items Addressed This Visit      Respiratory   Allergic rhinitis   Relevant Medications   levocetirizine (XYZAL) 5 MG tablet      Other   GAD (generalized anxiety disorder) - Primary    Improved overall. She is no longer taking wellbutrin. Given buspar which she can take prn. She will let me know how she is doing       Relevant Medications   busPIRone (BUSPAR) 7.5 MG tablet   traZODone (DESYREL) 50 MG tablet   Insomnia    Uncontrolled. Ambien doesn't work for her anymore. Trial of prn trazodone.           I have discontinued Richarda Osmond. Shain's buPROPion, hydrOXYzine, buPROPion, and zolpidem. I am also having her start on busPIRone and traZODone. Additionally, I am having her maintain her ProAir RespiClick, gabapentin, montelukast, norgestimate-ethinyl estradiol, and levocetirizine.   Meds ordered this encounter  Medications  . levocetirizine (XYZAL) 5 MG tablet    Sig: TAKE (1) TABLET BY MOUTH EVERY DAY    Dispense:  90 tablet    Refill:  0    Order Specific Question:   Supervising Provider    Answer:   Duncan Dull L [[id number]]  . busPIRone (BUSPAR) 7.5 MG tablet    Sig: Take 1 tablet (7.5 mg total) by mouth 2 (two) times daily as needed.    Dispense:  60 tablet    Refill:  2    Order Specific Question:   Supervising Provider    Answer:   Duncan Dull L [[id number]]  . traZODone (DESYREL) 50 MG tablet    Sig: Take 0.5-1 tablets (25-50 mg total) by mouth at bedtime as needed for sleep.    Dispense:  90 tablet    Refill:  3    Order Specific Question:   Supervising Provider    Answer:   Sherlene Shams [[id number]]    Return precautions given.   Risks, benefits, and alternatives of the medications and treatment plan prescribed today were discussed, and patient expressed understanding.   Education regarding symptom management and diagnosis given to patient on AVS.  Continue to follow with Allegra Grana, FNP for routine health maintenance.   [name] and I agreed with plan.   Rennie Plowman, FNP

## 2020-11-09 NOTE — Patient Instructions (Signed)
Start trazodone, buspar  Let me know how you are doing

## 2020-11-10 NOTE — Assessment & Plan Note (Signed)
Uncontrolled. Ambien doesn't work for her anymore. Trial of prn trazodone.

## 2020-11-10 NOTE — Assessment & Plan Note (Signed)
Improved overall. She is no longer taking wellbutrin. Given buspar which she can take prn. She will let me know how she is doing

## 2020-11-11 ENCOUNTER — Telehealth: Payer: Self-pay | Admitting: Family

## 2020-11-11 NOTE — Telephone Encounter (Signed)
Tracey [name] Key: [id number] - Rx #: [id number]   Drug busPIRone HCl 7.5MG  tablets

## 2020-11-14 NOTE — Telephone Encounter (Signed)
Pt called to follow up on PA 

## 2020-11-15 ENCOUNTER — Telehealth: Payer: Self-pay

## 2020-11-15 DIAGNOSIS — F411 Generalized anxiety disorder: Secondary | ICD-10-CM

## 2020-11-15 NOTE — Telephone Encounter (Signed)
Prior authorization was denied for busPIRone HCl 7.5MG  tablets, patient said is there something else she can take.

## 2020-11-15 NOTE — Telephone Encounter (Signed)
Patient was already notified by Kerin Salen that buspirone was denied. Patient would like an alternative to the prescribed medication. Per the basis for determination, The medication was denied because 2 alternatives on the patients formulary have not been tried. Alternatives for the medication are Buspirone 5mg  and Buspirone 15mg . Denial letter has been placed on Margaret's Desk.

## 2020-11-16 MED ORDER — BUSPIRONE HCL 5 MG PO TABS: 5.0000 mg | ORAL_TABLET | Freq: Three times a day (TID) | ORAL | 1 refills | Status: DC | PRN

## 2020-11-16 NOTE — Telephone Encounter (Signed)
Call pt  I sent in buspar 5mg  which should be approved per her insurance

## 2020-11-16 NOTE — Addendum Note (Signed)
Addended by: Burnard Hawthorne on: 11/16/2020 01:35 PM   Modules accepted: Orders

## 2020-11-17 NOTE — Telephone Encounter (Signed)
Spoke with pt and informed her of the medication that was sent in that should be approved by her insurance. Pt was wondering if she was supposed to take two pills at time since the higher dose was not approved.

## 2020-11-18 NOTE — Telephone Encounter (Signed)
Call pt Do not increase buspar dose ( yet) Start with buspar 5mg  that I sent in and follow instructions She may make f/u appt if any concerns

## 2020-11-21 DIAGNOSIS — M7672 Peroneal tendinitis, left leg: Secondary | ICD-10-CM | POA: Diagnosis not present

## 2020-11-21 DIAGNOSIS — M79671 Pain in right foot: Secondary | ICD-10-CM | POA: Diagnosis not present

## 2020-11-21 DIAGNOSIS — M2021 Hallux rigidus, right foot: Secondary | ICD-10-CM | POA: Diagnosis not present

## 2020-11-21 DIAGNOSIS — M79672 Pain in left foot: Secondary | ICD-10-CM | POA: Diagnosis not present

## 2020-11-21 NOTE — Telephone Encounter (Signed)
Mailbox is full and cannot accept any messages at this time.

## 2020-11-24 NOTE — Telephone Encounter (Signed)
I called patient & she stated that after taking 5mg  df Buspar she can tell no difference. She said that she feels she needs to go back on the Wellbutrin & she may not have realized how much it was actually helping her at the time. She said that she has a lot going on in her family that can be stressful. She said that she would be willing to try the buspar for acute anxiety along with the Wellbutrin. Please advise?

## 2020-11-25 MED ORDER — BUPROPION HCL ER (XL) 150 MG PO TB24: 150.0000 mg | ORAL_TABLET | Freq: Every morning | ORAL | 3 refills | Status: DC

## 2020-11-25 NOTE — Addendum Note (Signed)
Addended by: Burnard Hawthorne on: 11/25/2020 10:05 AM   Modules accepted: Orders

## 2020-11-25 NOTE — Telephone Encounter (Signed)
I called patient & she was willing to start Wellbutrin at 150mg  dose as opposed to 150mg  BID. She will take for 6 weeks then have VV to f/u. Also advised at highest point of anxiety to try taking the 10mg  Buspar & using the other 5mg  with she had another episode not as severe. Pt agreeable to plan & scheduled to f/u 4/8.

## 2020-11-25 NOTE — Telephone Encounter (Signed)
Call pt She can restart wellbutrin 150mg ; I sent back in. Ensure she doesn't feel wellbutrin worsens anxiety   For buspar, I prescribed 5mg  TID.   she can increase to 10mg  for ONE dose once per day when anxiety is highest, she may continue taking 5mg  for the other two doses per day.   sch f/u in 6-8 weeks, sooner if needed

## 2020-11-29 DIAGNOSIS — M545 Low back pain, unspecified: Secondary | ICD-10-CM | POA: Diagnosis not present

## 2021-01-06 ENCOUNTER — Encounter: Payer: Self-pay | Admitting: Family

## 2021-01-06 ENCOUNTER — Telehealth (INDEPENDENT_AMBULATORY_CARE_PROVIDER_SITE_OTHER): Payer: BC Managed Care – PPO | Admitting: Family

## 2021-01-06 VITALS — Ht 61.2 in

## 2021-01-06 DIAGNOSIS — F411 Generalized anxiety disorder: Secondary | ICD-10-CM | POA: Diagnosis not present

## 2021-01-06 DIAGNOSIS — Z1211 Encounter for screening for malignant neoplasm of colon: Secondary | ICD-10-CM | POA: Diagnosis not present

## 2021-01-06 MED ORDER — TRAZODONE HCL 50 MG PO TABS: 50.0000 mg | ORAL_TABLET | Freq: Every day | ORAL | 3 refills | Status: DC

## 2021-01-06 MED ORDER — BUSPIRONE HCL 10 MG PO TABS: 10.0000 mg | ORAL_TABLET | Freq: Every day | ORAL | 1 refills | Status: DC

## 2021-01-06 MED ORDER — BUSPIRONE HCL 5 MG PO TABS: 5.0000 mg | ORAL_TABLET | Freq: Two times a day (BID) | ORAL | 2 refills | Status: DC
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
## 2021-01-06 NOTE — Assessment & Plan Note (Signed)
Overall controlled. Patient is driving again and we opted not to make medication changes. She will continue wellbutrin 150mg  qd , buspar 5mg  qam, 5mg  midday, and 10mg  qhs, trazodone 50mg . She will let me know of any concerns.

## 2021-01-30 ENCOUNTER — Other Ambulatory Visit: Payer: Self-pay | Admitting: Family

## 2021-01-30 DIAGNOSIS — M545 Low back pain, unspecified: Secondary | ICD-10-CM

## 2021-02-23 ENCOUNTER — Ambulatory Visit
Admission: RE | Admit: 2021-02-23 | Discharge: 2021-02-23 | Disposition: A | Discharge status: Home / Self Care | Payer: BC Managed Care – PPO | Source: Ambulatory Visit | Attending: Obstetrics and Gynecology | Admitting: Obstetrics and Gynecology

## 2021-02-23 ENCOUNTER — Other Ambulatory Visit: Payer: Self-pay

## 2021-02-23 DIAGNOSIS — Z1231 Encounter for screening mammogram for malignant neoplasm of breast: Secondary | ICD-10-CM | POA: Insufficient documentation

## 2021-02-23 DIAGNOSIS — N951 Menopausal and female climacteric states: Secondary | ICD-10-CM | POA: Diagnosis not present

## 2021-02-28 DIAGNOSIS — F419 Anxiety disorder, unspecified: Secondary | ICD-10-CM | POA: Diagnosis not present

## 2021-02-28 DIAGNOSIS — R6882 Decreased libido: Secondary | ICD-10-CM | POA: Diagnosis not present

## 2021-02-28 DIAGNOSIS — N951 Menopausal and female climacteric states: Secondary | ICD-10-CM | POA: Diagnosis not present

## 2021-02-28 DIAGNOSIS — R232 Flushing: Secondary | ICD-10-CM | POA: Diagnosis not present

## 2021-03-02 ENCOUNTER — Other Ambulatory Visit: Payer: Self-pay | Admitting: Obstetrics and Gynecology

## 2021-03-02 DIAGNOSIS — M2022 Hallux rigidus, left foot: Secondary | ICD-10-CM | POA: Diagnosis not present

## 2021-03-02 DIAGNOSIS — M79671 Pain in right foot: Secondary | ICD-10-CM | POA: Diagnosis not present

## 2021-03-02 DIAGNOSIS — R928 Other abnormal and inconclusive findings on diagnostic imaging of breast: Secondary | ICD-10-CM

## 2021-03-02 DIAGNOSIS — N6489 Other specified disorders of breast: Secondary | ICD-10-CM

## 2021-03-02 DIAGNOSIS — N6320 Unspecified lump in the left breast, unspecified quadrant: Secondary | ICD-10-CM

## 2021-03-02 DIAGNOSIS — M79672 Pain in left foot: Secondary | ICD-10-CM | POA: Diagnosis not present

## 2021-03-02 DIAGNOSIS — M2021 Hallux rigidus, right foot: Secondary | ICD-10-CM | POA: Diagnosis not present

## 2021-03-07 ENCOUNTER — Other Ambulatory Visit: Payer: Self-pay

## 2021-03-07 ENCOUNTER — Ambulatory Visit
Admission: RE | Admit: 2021-03-07 | Discharge: 2021-03-07 | Disposition: A | Discharge status: Home / Self Care | Payer: BC Managed Care – PPO | Source: Ambulatory Visit | Attending: Obstetrics and Gynecology | Admitting: Obstetrics and Gynecology

## 2021-03-07 DIAGNOSIS — N6489 Other specified disorders of breast: Secondary | ICD-10-CM

## 2021-03-07 DIAGNOSIS — N6320 Unspecified lump in the left breast, unspecified quadrant: Secondary | ICD-10-CM | POA: Insufficient documentation

## 2021-03-07 DIAGNOSIS — R928 Other abnormal and inconclusive findings on diagnostic imaging of breast: Secondary | ICD-10-CM | POA: Insufficient documentation

## 2021-03-28 DIAGNOSIS — N951 Menopausal and female climacteric states: Secondary | ICD-10-CM | POA: Diagnosis not present

## 2021-04-10 ENCOUNTER — Other Ambulatory Visit: Payer: Self-pay | Admitting: Family

## 2021-04-10 ENCOUNTER — Telehealth: Payer: Self-pay | Admitting: Family

## 2021-04-10 DIAGNOSIS — F411 Generalized anxiety disorder: Secondary | ICD-10-CM

## 2021-04-10 MED ORDER — BUPROPION HCL ER (XL) 150 MG PO TB24: 300.0000 mg | ORAL_TABLET | Freq: Every morning | ORAL | 3 refills | Status: DC

## 2021-04-10 NOTE — Telephone Encounter (Signed)
Patient has been taking 300 mg daily and now she is out because she was taking buPROPion (WELLBUTRIN XL) 150 MG 24 hr tablet.  So she has been taking two a day per Arnett.

## 2021-04-10 NOTE — Telephone Encounter (Signed)
LMTCB

## 2021-04-10 NOTE — Telephone Encounter (Signed)
Call pt  She can resume wellbutrin Advise she must always wean to 150mg  dose for a couple of days versus stopping so abruptly.  I know she didn't plan on that but when she is on last month of prescription going forward she needs to call us then for refill  I have refilled wellbutrin 150mg , she can take 1 tablet for 7 days and then increase to two tablets  She will need baseline EKG for anxiety medications which we have not done. Any way she can come in the next 6 weeks versus waiting till her scheduled Dec appt.   Please sch and make note " baseline EKG'  Remind pt that no more than one alcoholic beverage on wellbutrin

## 2021-04-10 NOTE — Telephone Encounter (Signed)
Pt called and advised on below. She is scheduled for EKG 05/08/21.

## 2021-04-10 NOTE — Addendum Note (Signed)
Addended by: Burnard Hawthorne on: 04/10/2021 02:14 PM   Modules accepted: Orders

## 2021-04-27 DIAGNOSIS — M2011 Hallux valgus (acquired), right foot: Secondary | ICD-10-CM | POA: Diagnosis not present

## 2021-04-27 DIAGNOSIS — M79671 Pain in right foot: Secondary | ICD-10-CM | POA: Diagnosis not present

## 2021-04-27 DIAGNOSIS — M2021 Hallux rigidus, right foot: Secondary | ICD-10-CM | POA: Diagnosis not present

## 2021-04-27 DIAGNOSIS — M2022 Hallux rigidus, left foot: Secondary | ICD-10-CM | POA: Diagnosis not present

## 2021-04-28 ENCOUNTER — Other Ambulatory Visit: Payer: Self-pay | Admitting: Podiatry

## 2021-05-08 ENCOUNTER — Other Ambulatory Visit: Payer: Self-pay

## 2021-05-08 ENCOUNTER — Ambulatory Visit: Payer: BC Managed Care – PPO | Admitting: Family

## 2021-05-08 ENCOUNTER — Encounter: Payer: Self-pay | Admitting: Family

## 2021-05-08 VITALS — BP 110/80 | HR 80 | Ht 61.2 in | Wt 131.2 lb

## 2021-05-08 DIAGNOSIS — J309 Allergic rhinitis, unspecified: Secondary | ICD-10-CM | POA: Diagnosis not present

## 2021-05-08 DIAGNOSIS — G8929 Other chronic pain: Secondary | ICD-10-CM

## 2021-05-08 DIAGNOSIS — F411 Generalized anxiety disorder: Secondary | ICD-10-CM | POA: Diagnosis not present

## 2021-05-08 DIAGNOSIS — M545 Low back pain, unspecified: Secondary | ICD-10-CM | POA: Diagnosis not present

## 2021-05-08 NOTE — Assessment & Plan Note (Signed)
Chronic, stable.  Continue gabapentin 300 mg nightly prn

## 2021-05-08 NOTE — Progress Notes (Signed)
Subjective:    Patient ID: Tracey [name], female    DOB: September 04, 1971, 50 y.o.   MRN: [id number]  CC: Tracey [name] is a 50 y.o. female who presents today for follow up.   HPI: Feels well today No new complaints  She is expecting a grandchild any [name] now and very excited.   Low back pain-compliant with gabapentin 300 mg prn. Back pain is stable at baseline.   Allergic rhinitis-compliant with Singulair 10 mg.  She hasnt used albuterol in a long time.   GAD- Anxiety overall controlled. compliant with Wellbutrin 150 mg, BuSpar 10 mg nightly.  She is also compliant with trazodone 50 mg. No si/hi.      HISTORY:  Past Medical History:  Diagnosis Date   Allergy    Seasonal   Anxiety    Cancer ([place]) skin   Depression    Frequent headaches    Past Surgical History:  Procedure Laterality Date   BACK SURGERY  12/31/2018   Dr Sharyne Richters; rod and screw   FOOT SURGERY Left    TUBAL LIGATION  2013   Family History  Problem Relation Age of Onset   Breast cancer Paternal Grandmother    Diabetes Paternal Grandmother    Other Mother        accidental death   Cancer Father        brain tumor   Melanoma Father    Lung cancer Father    Alcohol abuse Brother    Drug abuse Brother    Anxiety disorder Brother    Depression Brother     Allergies: Molds & smuts Current Outpatient Medications on File Prior to Visit  Medication Sig Dispense Refill   buPROPion (WELLBUTRIN XL) 150 MG 24 hr tablet Take 2 tablets (300 mg total) by mouth every morning. 120 tablet 3   busPIRone (BUSPAR) 10 MG tablet Take 1 tablet (10 mg total) by mouth at bedtime. 90 tablet 1   gabapentin (NEURONTIN) 300 MG capsule TAKE 1 CAPSULE BY MOUTH AT BEDTIME 90 capsule 3   meloxicam (MOBIC) 15 MG tablet Take 1 tablet by mouth daily.     traZODone (DESYREL) 50 MG tablet Take 1 tablet (50 mg total) by mouth at bedtime. 90 tablet 3   No current facility-administered medications on file prior to visit.     Social History   Tobacco Use   Smoking status: Never   Smokeless tobacco: Never  Vaping Use   Vaping Use: Never used  Substance Use Topics   Alcohol use: Not Currently    Alcohol/week: 4.0 - 8.0 standard drinks    Types: 2 - 6 Glasses of wine, 2 Shots of liquor per week   Drug use: No    Review of Systems  Constitutional:  Negative for chills and fever.  Respiratory:  Negative for cough.   Cardiovascular:  Negative for chest pain and palpitations.  Gastrointestinal:  Negative for nausea and vomiting.     Objective:    BP 110/80   Pulse 80   Ht 5' 1.2" (1.554 m)   Wt 131 lb 3.2 oz (59.5 kg)   SpO2 98%   BMI 24.63 kg/m  BP Readings from Last 3 Encounters:  05/08/21 110/80  11/09/20 100/76  12/15/19 102/78   Wt Readings from Last 3 Encounters:  05/08/21 131 lb 3.2 oz (59.5 kg)  11/09/20 136 lb 12.8 oz (62.1 kg)  12/15/19 133 lb 9.6 oz (60.6 kg)    Physical Exam Vitals reviewed.  Constitutional:      Appearance: She is well-developed.  Eyes:     Conjunctiva/sclera: Conjunctivae normal.  Cardiovascular:     Rate and Rhythm: Normal rate and regular rhythm.     Pulses: Normal pulses.     Heart sounds: Normal heart sounds.  Pulmonary:     Effort: Pulmonary effort is normal.     Breath sounds: Normal breath sounds. No wheezing, rhonchi or rales.  Skin:    General: Skin is warm and dry.  Neurological:     Mental Status: She is alert.  Psychiatric:        Speech: Speech normal.        Behavior: Behavior normal.        Thought Content: Thought content normal.       Assessment & Plan:   Problem List Items Addressed This Visit       Respiratory   Allergic rhinitis    Chronic, stable.  Continue Singulair 10 mg.         Other   GAD (generalized anxiety disorder) - Primary    Chronic, stable on BuSpar '10mg'$  qhs, Wellbutrin '150mg'$  , trazodone '50mg'$ .  Baseline EKG shows sinus rhythm.  QT interval 364.  No ischemia.  No prior EKG to compare too.  Continue  regimen.        Relevant Orders   EKG 12-Lead (Completed)   Low back pain    Chronic, stable.  Continue gabapentin 300 mg nightly prn         I have discontinued Clent Ridges. Nixon's ProAir RespiClick, montelukast, norgestimate-ethinyl estradiol, and levocetirizine. I am also having her maintain her meloxicam, busPIRone, traZODone, gabapentin, and buPROPion.   No orders of the defined types were placed in this encounter.   Return precautions given.   Risks, benefits, and alternatives of the medications and treatment plan prescribed today were discussed, and patient expressed understanding.   Education regarding symptom management and diagnosis given to patient on AVS.  Continue to follow with Burnard Hawthorne, FNP for routine health maintenance.   [place] and I agreed with plan.   Mable Paris, FNP

## 2021-05-08 NOTE — Assessment & Plan Note (Addendum)
Chronic, stable on BuSpar '10mg'$  qhs, Wellbutrin '150mg'$  , trazodone '50mg'$ .  Baseline EKG shows sinus rhythm.  QT interval 364.  No ischemia.  No prior EKG to compare too.  Continue regimen.

## 2021-05-08 NOTE — Assessment & Plan Note (Signed)
Chronic, stable.  Continue Singulair 10 mg.

## 2021-05-12 ENCOUNTER — Other Ambulatory Visit: Payer: Self-pay

## 2021-05-12 ENCOUNTER — Encounter
Admission: RE | Admit: 2021-05-12 | Discharge: 2021-05-12 | Disposition: A | Discharge status: Home / Self Care | Payer: BC Managed Care – PPO | Source: Ambulatory Visit | Attending: Podiatry | Admitting: Podiatry

## 2021-05-12 HISTORY — DX: Unspecified osteoarthritis, unspecified site: M19.90

## 2021-05-12 HISTORY — DX: Ileus, unspecified: K56.7

## 2021-05-12 HISTORY — DX: Other complications of anesthesia, initial encounter: T88.59XA

## 2021-05-12 NOTE — Patient Instructions (Addendum)
Your procedure is scheduled on:05-19-21 Friday Report to the Registration Desk on the 1st floor of the [facility]-Then proceed to the 2nd floor Surgery Desk in the [facility] To find out your arrival time, please call [phone] between 1PM - 3PM on:05-18-21 Thursday  REMEMBER: Instructions that are not followed completely may result in serious medical risk, up to and including death; or upon the discretion of your surgeon and anesthesiologist your surgery may need to be rescheduled.  Do not eat food after midnight the night before surgery.  No gum chewing, lozengers or hard candies.  You may however, drink CLEAR liquids up to 2 hours before you are scheduled to arrive for your surgery. Do not drink anything within 2 hours of your scheduled arrival time.  Clear liquids include: - water  - apple juice without pulp - gatorade - black coffee or tea (Do NOT add milk or creamers to the coffee or tea) Do NOT drink anything that is not on this list.  In addition, your doctor has ordered for you to drink the provided  Ensure Pre-Surgery Clear Carbohydrate Drink  Drinking this carbohydrate drink up to two hours before surgery helps to reduce insulin resistance and improve patient outcomes. Please complete drinking 2 hours prior to scheduled arrival time.  TAKE THESE MEDICATIONS THE MORNING OF SURGERY WITH A SIP OF WATER: -Wellbutrin (Bupropion) -You may take Buspar (Buspirone) the morning of surgery if needed for anxiety  One week prior to surgery: Stop Anti-inflammatories (NSAIDS) such as Advil, Aleve, Ibuprofen, Motrin, Naproxen, Naprosyn and Aspirin based products such as Excedrin, Goodys Powder, BC Powder.You may however, continue to take Tylenol if needed for pain up until the day of surgery.  Stop ANY OVER THE COUNTER supplements/vitamins NOW (05-12-21)until after surgery (Vitamin D and Multivitamin)  No Alcohol for 24 hours before or after surgery.  No Smoking including  e-cigarettes for 24 hours prior to surgery.  No chewable tobacco products for at least 6 hours prior to surgery.  No nicotine patches on the day of surgery.  Do not use any "recreational" drugs for at least a week prior to your surgery.  Please be advised that the combination of cocaine and anesthesia may have negative outcomes, up to and including death. If you test positive for cocaine, your surgery will be cancelled.  On the morning of surgery brush your teeth with toothpaste and water, you may rinse your mouth with mouthwash if you wish. Do not swallow any toothpaste or mouthwash.  Do not wear jewelry, make-up, hairpins, clips or nail polish.  Do not wear lotions, powders, or perfumes.   Do not shave body from the neck down 48 hours prior to surgery just in case you cut yourself which could leave a site for infection.  Also, freshly shaved skin may become irritated if using the CHG soap.  Contact lenses, hearing aids and dentures may not be worn into surgery.  Do not bring valuables to the hospital. [facility] is not responsible for any missing/lost belongings or valuables.   Use CHG Soap as directed on instruction sheet.  Notify your doctor if there is any change in your medical condition (cold, fever, infection).  Wear comfortable clothing (specific to your surgery type) to the hospital.  After surgery, you can help prevent lung complications by doing breathing exercises.  Take deep breaths and cough every 1-2 hours. Your doctor may order a device called an Incentive Spirometer to help you take deep breaths. When coughing or  sneezing, hold a pillow firmly against your incision with both hands. This is called "splinting." Doing this helps protect your incision. It also decreases belly discomfort.  If you are being admitted to the hospital overnight, leave your suitcase in the car. After surgery it may be brought to your room.  If you are being discharged the day of surgery,  you will not be allowed to drive home. You will need a responsible adult (18 years or older) to drive you home and stay with you that night.   If you are taking public transportation, you will need to have a responsible adult (18 years or older) with you. Please confirm with your physician that it is acceptable to use public transportation.   Please call the [facility] Dept. at [phone] if you have any questions about these instructions.  Surgery Visitation Policy:  Patients undergoing a surgery or procedure may have one family member or support person with them as long as that person is not COVID-19 positive or experiencing its symptoms.  That person may remain in the waiting area during the procedure.  Inpatient Visitation:    Visiting hours are 7 a.m. to 8 p.m. Inpatients will be allowed two visitors daily. The visitors may change each day during the patient's stay. No visitors under the age of 68. Any visitor under the age of 53 must be accompanied by an adult. The visitor must pass COVID-19 screenings, use hand sanitizer when entering and exiting the patient's room and wear a mask at all times, including in the patient's room. Patients must also wear a mask when staff or their visitor are in the room. Masking is required regardless of vaccination status.

## 2021-05-18 MED ORDER — CEFAZOLIN SODIUM-DEXTROSE 2-4 GM/100ML-% IV SOLN
2.0000 g | INTRAVENOUS | Status: AC
  Administered 2021-05-19: 2 g via INTRAVENOUS

## 2021-05-19 ENCOUNTER — Encounter: Payer: Self-pay | Admitting: Podiatry

## 2021-05-19 ENCOUNTER — Other Ambulatory Visit: Payer: Self-pay

## 2021-05-19 ENCOUNTER — Ambulatory Visit: Payer: BC Managed Care – PPO | Admitting: Certified Registered"

## 2021-05-19 ENCOUNTER — Ambulatory Visit: Payer: BC Managed Care – PPO

## 2021-05-19 ENCOUNTER — Encounter
Admission: RE | Disposition: A | Discharge status: Home / Self Care | Payer: Self-pay | Source: Home / Self Care | Attending: Podiatry

## 2021-05-19 ENCOUNTER — Ambulatory Visit
Admission: RE | Admit: 2021-05-19 | Discharge: 2021-05-19 | Disposition: A | Discharge status: Home / Self Care | Payer: BC Managed Care – PPO | Attending: Podiatry | Admitting: Podiatry

## 2021-05-19 DIAGNOSIS — M2011 Hallux valgus (acquired), right foot: Secondary | ICD-10-CM | POA: Diagnosis not present

## 2021-05-19 DIAGNOSIS — M2021 Hallux rigidus, right foot: Secondary | ICD-10-CM | POA: Diagnosis not present

## 2021-05-19 DIAGNOSIS — M2022 Hallux rigidus, left foot: Secondary | ICD-10-CM | POA: Insufficient documentation

## 2021-05-19 DIAGNOSIS — Z79899 Other long term (current) drug therapy: Secondary | ICD-10-CM | POA: Insufficient documentation

## 2021-05-19 DIAGNOSIS — M79672 Pain in left foot: Secondary | ICD-10-CM | POA: Diagnosis not present

## 2021-05-19 HISTORY — PX: ARTHRODESIS METATARSALPHALANGEAL JOINT (MTPJ): SHX6566

## 2021-05-19 LAB — POCT PREGNANCY, URINE: Preg Test, Ur: NEGATIVE

## 2021-05-19 SURGERY — FUSION, JOINT, GREAT TOE
Anesthesia: General | Site: Toe | Laterality: Bilateral

## 2021-05-19 MED ORDER — DEXMEDETOMIDINE (PRECEDEX) IN NS 20 MCG/5ML (4 MCG/ML) IV SYRINGE
PREFILLED_SYRINGE | INTRAVENOUS | Status: AC
  Filled 2021-05-19: qty 5

## 2021-05-19 MED ORDER — FENTANYL CITRATE (PF) 100 MCG/2ML IJ SOLN
INTRAMUSCULAR | Status: AC
  Filled 2021-05-19: qty 2

## 2021-05-19 MED ORDER — PROPOFOL 10 MG/ML IV BOLUS
INTRAVENOUS | Status: AC
  Filled 2021-05-19: qty 20

## 2021-05-19 MED ORDER — 0.9 % SODIUM CHLORIDE (POUR BTL) OPTIME
TOPICAL | Status: DC | PRN
  Administered 2021-05-19: 1000 mL
  Administered 2021-05-19: 500 mL

## 2021-05-19 MED ORDER — OXYCODONE HCL 5 MG PO TABS: 5.0000 mg | ORAL_TABLET | Freq: Once | ORAL | Status: DC | PRN

## 2021-05-19 MED ORDER — METOCLOPRAMIDE HCL 5 MG/ML IJ SOLN: 5.0000 mg | Freq: Three times a day (TID) | INTRAMUSCULAR | Status: DC | PRN

## 2021-05-19 MED ORDER — PROPOFOL 10 MG/ML IV BOLUS
INTRAVENOUS | Status: DC | PRN
  Administered 2021-05-19: 150 mg via INTRAVENOUS

## 2021-05-19 MED ORDER — ONDANSETRON HCL 4 MG/2ML IJ SOLN: 4.0000 mg | Freq: Four times a day (QID) | INTRAMUSCULAR | Status: DC | PRN

## 2021-05-19 MED ORDER — DEXAMETHASONE SODIUM PHOSPHATE 10 MG/ML IJ SOLN
INTRAMUSCULAR | Status: AC
  Filled 2021-05-19: qty 1

## 2021-05-19 MED ORDER — METOCLOPRAMIDE HCL 10 MG PO TABS: 5.0000 mg | ORAL_TABLET | Freq: Three times a day (TID) | ORAL | Status: DC | PRN

## 2021-05-19 MED ORDER — CHLORHEXIDINE GLUCONATE 0.12 % MT SOLN
OROMUCOSAL | Status: AC
  Administered 2021-05-19: 15 mL via OROMUCOSAL
  Filled 2021-05-19: qty 15

## 2021-05-19 MED ORDER — LIDOCAINE HCL (PF) 2 % IJ SOLN
INTRAMUSCULAR | Status: DC | PRN
  Administered 2021-05-19: 50 mg

## 2021-05-19 MED ORDER — MIDAZOLAM HCL 2 MG/2ML IJ SOLN
INTRAMUSCULAR | Status: AC
  Filled 2021-05-19: qty 2

## 2021-05-19 MED ORDER — MEPERIDINE HCL 25 MG/ML IJ SOLN: 6.2500 mg | INTRAMUSCULAR | Status: DC | PRN

## 2021-05-19 MED ORDER — DEXMEDETOMIDINE (PRECEDEX) IN NS 20 MCG/5ML (4 MCG/ML) IV SYRINGE
PREFILLED_SYRINGE | INTRAVENOUS | Status: DC | PRN
  Administered 2021-05-19: 8 ug via INTRAVENOUS

## 2021-05-19 MED ORDER — PROMETHAZINE HCL 25 MG/ML IJ SOLN: 6.2500 mg | INTRAMUSCULAR | Status: DC | PRN

## 2021-05-19 MED ORDER — FENTANYL CITRATE (PF) 100 MCG/2ML IJ SOLN
INTRAMUSCULAR | Status: DC | PRN
  Administered 2021-05-19: 50 ug via INTRAVENOUS
  Administered 2021-05-19 (×2): 25 ug via INTRAVENOUS

## 2021-05-19 MED ORDER — EPHEDRINE SULFATE 50 MG/ML IJ SOLN
INTRAMUSCULAR | Status: DC | PRN
  Administered 2021-05-19: 10 mg via INTRAVENOUS
  Administered 2021-05-19: 15 mg via INTRAVENOUS

## 2021-05-19 MED ORDER — ONDANSETRON HCL 4 MG/2ML IJ SOLN
INTRAMUSCULAR | Status: DC | PRN
  Administered 2021-05-19: 4 mg via INTRAVENOUS

## 2021-05-19 MED ORDER — MIDAZOLAM HCL 5 MG/5ML IJ SOLN
INTRAMUSCULAR | Status: DC | PRN
  Administered 2021-05-19: 2 mg via INTRAVENOUS

## 2021-05-19 MED ORDER — ORAL CARE MOUTH RINSE: 15.0000 mL | Freq: Once | OROMUCOSAL | Status: AC

## 2021-05-19 MED ORDER — BUPIVACAINE LIPOSOME 1.3 % IJ SUSP
INTRAMUSCULAR | Status: DC | PRN
  Administered 2021-05-19: 10 mL

## 2021-05-19 MED ORDER — LACTATED RINGERS IV SOLN: INTRAVENOUS | Status: DC

## 2021-05-19 MED ORDER — OXYCODONE HCL 5 MG/5ML PO SOLN: 5.0000 mg | Freq: Once | ORAL | Status: DC | PRN

## 2021-05-19 MED ORDER — FAMOTIDINE 20 MG PO TABS
ORAL_TABLET | ORAL | Status: AC
  Administered 2021-05-19: 20 mg via ORAL
  Filled 2021-05-19: qty 1

## 2021-05-19 MED ORDER — ONDANSETRON HCL 4 MG PO TABS: 4.0000 mg | ORAL_TABLET | Freq: Four times a day (QID) | ORAL | Status: DC | PRN

## 2021-05-19 MED ORDER — DEXAMETHASONE SODIUM PHOSPHATE 10 MG/ML IJ SOLN
INTRAMUSCULAR | Status: DC | PRN
  Administered 2021-05-19: 10 mg via INTRA_ARTICULAR

## 2021-05-19 MED ORDER — FENTANYL CITRATE (PF) 100 MCG/2ML IJ SOLN
25.0000 ug | INTRAMUSCULAR | Status: AC | PRN
  Administered 2021-05-19 (×5): 25 ug via INTRAVENOUS

## 2021-05-19 MED ORDER — BUPIVACAINE HCL (PF) 0.5 % IJ SOLN
INTRAMUSCULAR | Status: DC | PRN
  Administered 2021-05-19: 2 mL via INTRA_ARTICULAR

## 2021-05-19 MED ORDER — BUPIVACAINE LIPOSOME 1.3 % IJ SUSP
INTRAMUSCULAR | Status: DC | PRN
  Administered 2021-05-19: 10 mL via INTRAMUSCULAR

## 2021-05-19 MED ORDER — FENTANYL CITRATE (PF) 100 MCG/2ML IJ SOLN
INTRAMUSCULAR | Status: AC
  Administered 2021-05-19: 25 ug via INTRAVENOUS
  Filled 2021-05-19: qty 2

## 2021-05-19 MED ORDER — POVIDONE-IODINE 7.5 % EX SOLN
Freq: Once | CUTANEOUS | Status: DC
  Filled 2021-05-19: qty 118

## 2021-05-19 MED ORDER — DEXAMETHASONE SODIUM PHOSPHATE 10 MG/ML IJ SOLN
INTRAMUSCULAR | Status: DC | PRN
  Administered 2021-05-19: 10 mg via INTRAVENOUS

## 2021-05-19 MED ORDER — CEFAZOLIN SODIUM-DEXTROSE 2-4 GM/100ML-% IV SOLN
INTRAVENOUS | Status: AC
  Filled 2021-05-19: qty 100

## 2021-05-19 MED ORDER — CHLORHEXIDINE GLUCONATE 0.12 % MT SOLN: 15.0000 mL | Freq: Once | OROMUCOSAL | Status: AC

## 2021-05-19 MED ORDER — LIDOCAINE HCL (PF) 2 % IJ SOLN
INTRAMUSCULAR | Status: AC
  Filled 2021-05-19: qty 5

## 2021-05-19 MED ORDER — GLYCOPYRROLATE 0.2 MG/ML IJ SOLN
INTRAMUSCULAR | Status: DC | PRN
  Administered 2021-05-19: .2 mg via INTRAVENOUS

## 2021-05-19 MED ORDER — GLYCOPYRROLATE 0.2 MG/ML IJ SOLN
INTRAMUSCULAR | Status: AC
  Filled 2021-05-19: qty 1

## 2021-05-19 MED ORDER — EPHEDRINE 5 MG/ML INJ
INTRAVENOUS | Status: AC
  Filled 2021-05-19: qty 5

## 2021-05-19 MED ORDER — PHENYLEPHRINE HCL (PRESSORS) 10 MG/ML IV SOLN
INTRAVENOUS | Status: DC | PRN
  Administered 2021-05-19 (×3): 100 ug via INTRAVENOUS

## 2021-05-19 MED ORDER — OXYCODONE-ACETAMINOPHEN 5-325 MG PO TABS: 1.0000 | ORAL_TABLET | Freq: Four times a day (QID) | ORAL | 0 refills | Status: DC | PRN

## 2021-05-19 MED ORDER — FAMOTIDINE 20 MG PO TABS: 20.0000 mg | ORAL_TABLET | Freq: Once | ORAL | Status: AC

## 2021-05-19 MED ORDER — ONDANSETRON HCL 4 MG/2ML IJ SOLN
INTRAMUSCULAR | Status: AC
  Filled 2021-05-19: qty 2

## 2021-05-19 SURGICAL SUPPLY — 56 items
"PENCIL ELECTRO HAND CTR " (MISCELLANEOUS) ×1 IMPLANT
BIT DRILL STRM CALB 2.0ST W/SL (BIT) IMPLANT
BLADE SURG 15 STRL LF DISP TIS (BLADE) ×2 IMPLANT
BLADE SURG 15 STRL SS (BLADE) ×4
BLADE SURG MINI STRL (BLADE) ×2 IMPLANT
BNDG CMPR STD VLCR NS LF 5.8X4 (GAUZE/BANDAGES/DRESSINGS) ×1
BNDG CONFORM 2 STRL LF (GAUZE/BANDAGES/DRESSINGS) ×2 IMPLANT
BNDG CONFORM 3 STRL LF (GAUZE/BANDAGES/DRESSINGS) ×2 IMPLANT
BNDG ELASTIC 4X5.8 VLCR NS LF (GAUZE/BANDAGES/DRESSINGS) ×2 IMPLANT
BNDG ESMARK 4X12 TAN STRL LF (GAUZE/BANDAGES/DRESSINGS) ×2 IMPLANT
BNDG GAUZE ELAST 4 BULKY (GAUZE/BANDAGES/DRESSINGS) ×2 IMPLANT
BOOT STEPPER DURA SM (SOFTGOODS) ×1 IMPLANT
CUFF TOURN SGL QUICK 12 (TOURNIQUET CUFF) IMPLANT
CUFF TOURN SGL QUICK 18X4 (TOURNIQUET CUFF) IMPLANT
DRAPE FLUOR MINI C-ARM 54X84 (DRAPES) ×1 IMPLANT
DRILL STRM CALIB 2.0 ST W/SL (BIT) ×2
DURAPREP 26ML APPLICATOR (WOUND CARE) ×2 IMPLANT
ELECT REM PT RETURN 9FT ADLT (ELECTROSURGICAL) ×2
ELECTRODE REM PT RTRN 9FT ADLT (ELECTROSURGICAL) ×1 IMPLANT
GAUZE 4X4 16PLY ~~LOC~~+RFID DBL (SPONGE) ×2 IMPLANT
GAUZE SPONGE 4X4 12PLY STRL (GAUZE/BANDAGES/DRESSINGS) ×2 IMPLANT
GAUZE XEROFORM 1X8 LF (GAUZE/BANDAGES/DRESSINGS) ×2 IMPLANT
GLOVE SURG ENC MOIS LTX SZ7.5 (GLOVE) ×2 IMPLANT
GLOVE SURG UNDER LTX SZ8 (GLOVE) ×2 IMPLANT
GOWN STRL REUS W/ TWL XL LVL3 (GOWN DISPOSABLE) ×2 IMPLANT
GOWN STRL REUS W/TWL XL LVL3 (GOWN DISPOSABLE) ×4
K-WIRE ACE 1.6X6 (WIRE) ×4
KIT INSTRUMENT MPJ STRATUM (KITS) ×1 IMPLANT
KIT TURNOVER KIT A (KITS) ×2 IMPLANT
KWIRE ACE 1.6X6 (WIRE) IMPLANT
LABEL OR SOLS (LABEL) ×2 IMPLANT
MANIFOLD NEPTUNE II (INSTRUMENTS) ×2 IMPLANT
NDL FILTER BLUNT 18X1 1/2 (NEEDLE) ×1 IMPLANT
NDL HYPO 25X1 1.5 SAFETY (NEEDLE) ×3 IMPLANT
NEEDLE FILTER BLUNT 18X 1/2SAF (NEEDLE)
NEEDLE FILTER BLUNT 18X1 1/2 (NEEDLE) IMPLANT
NEEDLE HYPO 25X1 1.5 SAFETY (NEEDLE) ×6 IMPLANT
NS IRRIG 1000ML POUR BTL (IV SOLUTION) ×1 IMPLANT
NS IRRIG 500ML POUR BTL (IV SOLUTION) ×2 IMPLANT
PACK EXTREMITY ARMC (MISCELLANEOUS) ×2 IMPLANT
PAD CAST CTTN 4X4 STRL (SOFTGOODS) ×1 IMPLANT
PADDING CAST COTTON 4X4 STRL (SOFTGOODS) ×2
PENCIL ELECTRO HAND CTR (MISCELLANEOUS) ×2 IMPLANT
PLATE MPJ 1ST STRM SM 0D RT (Plate) ×1 IMPLANT
SCREW LOCK ST STRM 2.7X10 (Screw) ×1 IMPLANT
SCREW LOCK ST STRM 2.7X16 (Screw) ×2 IMPLANT
SCREW LOCK ST STRM 2.7X18 (Screw) ×1 IMPLANT
SCREW STRM LOCK 2.7X12 ST (Screw) ×1 IMPLANT
STOCKINETTE M/LG 89821 (MISCELLANEOUS) ×2 IMPLANT
STRIP CLOSURE SKIN 1/4X4 (GAUZE/BANDAGES/DRESSINGS) ×2 IMPLANT
SUT MNCRL AB 4-0 PS2 18 (SUTURE) ×1 IMPLANT
SUT VIC AB 3-0 SH 27 (SUTURE) ×2
SUT VIC AB 3-0 SH 27X BRD (SUTURE) IMPLANT
SUT VIC AB 4-0 FS2 27 (SUTURE) ×2 IMPLANT
SYR 10ML LL (SYRINGE) ×4 IMPLANT
WIRE Z .062 C-WIRE SPADE TIP (WIRE) ×1 IMPLANT

## 2021-05-19 NOTE — Anesthesia Postprocedure Evaluation (Signed)
Anesthesia Post Note  Patient: Tracey [name]  Procedure(s) Performed: RIGHT ARTHRODESIS METATARSALPHALANGEAL JOINT (MTPJ) , LEFT GREAT TOE INJECTION (Bilateral: Toe)  Patient location during evaluation: PACU Anesthesia Type: General Level of consciousness: awake and alert and oriented Pain management: pain level controlled Vital Signs Assessment: post-procedure vital signs reviewed and stable Respiratory status: spontaneous breathing, nonlabored ventilation and respiratory function stable Cardiovascular status: blood pressure returned to baseline and stable Postop Assessment: no signs of nausea or vomiting Anesthetic complications: no   No notable events documented.   Last Vitals:  Vitals:   05/19/21 1150 05/19/21 1155  BP:  126/88  Pulse: (!) 28 84  Resp:  18  Temp:  (!) 36.3 C  SpO2: (!) 78% 100%    Last Pain:  Vitals:   05/19/21 1155  TempSrc: Temporal  PainSc: 1                  Savvy Peeters

## 2021-05-19 NOTE — H&P (Signed)
HISTORY AND PHYSICAL INTERVAL NOTE:  05/19/2021  8:40 AM  Tracey [name]  has presented today for surgery, with the diagnosis of M20.11- Hallux valgus of right foot.  The various methods of treatment have been discussed with the patient.  No guarantees were given.  After consideration of risks, benefits and other options for treatment, the patient has consented to surgery.  I have reviewed the patients' chart and labs.     A history and physical examination was performed in my office.  The patient was reexamined.  There have been no changes to this history and physical examination.  Tracey [name] A

## 2021-05-19 NOTE — Op Note (Signed)
Operative note   Surgeon:Hampton Cost Lawyer: None    Preop diagnosis: Hallux rigidus right first MTPJ 2.  Hallux rigidus left first MTPJ    Postop diagnosis: Same    Procedure: 1.  First MTPJ arthrodesis right foot 2.  Injection dexamethasone left first MTPJ    EBL: Minimal    Anesthesia:local and general.  Local consisted of a one-to-one mixture of 0.5% bupivacaine and Exparel long-acting anesthetic preoperatively.  A total of 10 cc of the mixture was used    Hemostasis: Mid calf tourniquet inflated to 200 mmHg for approximately 70 minutes    Specimen: None    Complications: None    Operative indications:Sparkles S Chrystal is an 50 y.o. that presents today for surgical intervention.  The risks/benefits/alternatives/complications have been discussed and consent has been given.    Procedure:  Patient was brought into the OR and placed on the operating table in thesupine position. After anesthesia was obtained theright lower extremity was prepped and draped in usual sterile fashion.  The left first MTPJ was also evaluated initially prior to sterile prep and drape.  There was noted to be a large amount of periarticular spurring around the first MTPJ that is previously been demonstrated in the office.  This was prepped with alcohol.  Next 4 mg of dexamethasone was infiltrated within the joint itself.  After final sterile prep and drape to the right lower extremity attention was directed to the first MTPJ where a dorsomedial incision was performed.  Sharp and blunt dissection down to the capsule.  Longitudinal capsulotomy was performed.  The para-articular spurring was noted and removed with a rongeur.  Was further smoothed thereafter at this time the MTPJ was evaluated.  There was noted to be less than 20% of articular cartilage remaining to the metatarsophalangeal joint itself.  The small residual amount of cartilage was removed.  Next a cup and cone reamer from the Biomet set was  used to prepare the joint.  This was taken down through the subchondral bone plate to good bleeding bone.  The joint was then flushed with copious amounts of irrigation.  The joint was then prepped with a 2.0 mm drill bit on both sides.  This was then stabilized with a K wire and in a anatomically aligned position with the toe only minimally dorsiflexed and line with the remaining toes a stratum Biomet first MTPJ small plate was applied with compression.  2.5 millimeter screws were used from the stratum set.  Good alignment was noted and excellent stability was noted.  The wound was flushed with copious amounts of irrigation.  Layered closure was performed with a 3-0 Vicryl the deeper layer, 4-0 Vicryl for the subcutaneous tissue and a 4-0 Monocryl for the skin.  The area was infiltrated with 10 cc of Exparel long-acting anesthetic postoperatively.    Patient tolerated the procedure and anesthesia well.  Was transported from the OR to the PACU with all vital signs stable and vascular status intact. To be discharged per routine protocol.  Will follow up in approximately 1 week in the outpatient clinic.

## 2021-05-19 NOTE — Transfer of Care (Signed)
Immediate Anesthesia Transfer of Care Note  Patient: Tracey [name]  Procedure(s) Performed: RIGHT ARTHRODESIS METATARSALPHALANGEAL JOINT (MTPJ) , LEFT GREAT TOE INJECTION (Bilateral: Toe)  Patient Location: PACU  Anesthesia Type:General  Level of Consciousness: sedated  Airway & Oxygen Therapy: Patient Spontanous Breathing and Patient connected to face mask oxygen  Post-op Assessment: Report given to RN and Post -op Vital signs reviewed and stable  Post vital signs: Reviewed  Last Vitals:  Vitals Value Taken Time  BP    Temp    Pulse 65 05/19/21 1049  Resp 11 05/19/21 1049  SpO2 100 % 05/19/21 1049  Vitals shown include unvalidated device data.  Last Pain:  Vitals:   05/19/21 0822  TempSrc: Oral  PainSc: 5          Complications: No notable events documented.

## 2021-05-19 NOTE — Anesthesia Preprocedure Evaluation (Signed)
Anesthesia Evaluation  Patient identified by MRN, date of birth, ID band Patient awake    Reviewed: Allergy & Precautions, NPO status , Patient's Chart, lab work & pertinent test results  History of Anesthesia Complications (+) Emergence Delirium and history of anesthetic complications  Airway Mallampati: II  TM Distance: >3 FB Neck ROM: Full    Dental no notable dental hx.    Pulmonary neg pulmonary ROS, neg sleep apnea, neg COPD,    breath sounds clear to auscultation- rhonchi (-) wheezing      Cardiovascular Exercise Tolerance: Good (-) hypertension(-) CAD, (-) Past MI, (-) Cardiac Stents and (-) CABG  Rhythm:Regular Rate:Normal - Systolic murmurs and - Diastolic murmurs    Neuro/Psych  Headaches, neg Seizures PSYCHIATRIC DISORDERS Anxiety Depression    GI/Hepatic negative GI ROS, Neg liver ROS,   Endo/Other  negative endocrine ROSneg diabetes  Renal/GU negative Renal ROS     Musculoskeletal  (+) Arthritis ,   Abdominal (+) - obese,   Peds  Hematology negative hematology ROS (+)   Anesthesia Other Findings Past Medical History: No date: Allergy     Comment:  Seasonal No date: Anxiety No date: Arthritis skin: Cancer ([place])     Comment:  basal cell neck No date: Complication of anesthesia     Comment:  Pt was very aggressive and agitated after back               surgery-developed ileus s after back surgery No date: Depression No date: Frequent headaches     Comment:  migraines No date: Ileus ([place])     Comment:  developed after back surgery   Reproductive/Obstetrics                             Anesthesia Physical Anesthesia Plan  ASA: 2  Anesthesia Plan: General   Post-op Pain Management:    Induction: Intravenous  PONV Risk Score and Plan: 2 and Ondansetron, Dexamethasone and Midazolam  Airway Management Planned: LMA  Additional Equipment:   Intra-op Plan:    Post-operative Plan:   Informed Consent: I have reviewed the patients History and Physical, chart, labs and discussed the procedure including the risks, benefits and alternatives for the proposed anesthesia with the patient or authorized representative who has indicated his/her understanding and acceptance.     Dental advisory given  Plan Discussed with: CRNA and Anesthesiologist  Anesthesia Plan Comments:         Anesthesia Quick Evaluation

## 2021-05-19 NOTE — Discharge Instructions (Addendum)
[facility]  POST OPERATIVE INSTRUCTIONS FOR DR. FOWLER AND [facility] POD[facility]AT[facility]   Take your medication as prescribed.  Pain medication should be taken only as needed.  Keep the dressing clean, dry and intact.  Keep your foot elevated above the heart level for the first 48 hours.  We have instructed you to be non-weight bearing.  Always wear your post-op shoe when walking.  Always use your crutches if you are to be non-weight bearing.  Do not take a shower. Baths are permissible as long as the foot is kept out of the water.   Every hour you are awake:  Bend your knee 15 times.  Call [facility] ([phone]) if any of the following problems occur: You develop a temperature or fever. The bandage becomes saturated with blood. Medication does not stop your pain. Injury of the foot occurs. Any symptoms of infection including redness, odor, or red streaks running from wound.  AMBULATORY SURGERY  DISCHARGE INSTRUCTIONS   The drugs that you were given will stay in your system until tomorrow so for the next 24 hours you should not:  Drive an automobile Make any legal decisions Drink any alcoholic beverage   You may resume regular meals tomorrow.  Today it is better to start with liquids and gradually work up to solid foods.  You may eat anything you prefer, but it is better to start with liquids, then soup and crackers, and gradually work up to solid foods.   Please notify your doctor immediately if you have any unusual bleeding, trouble breathing, redness and pain at the surgery site, drainage, fever, or pain not relieved by medication.    Additional Instructions:  Please contact your physician with any problems or Same Day Surgery at [phone], Monday through Friday 6 am to 4 pm, or [place] at  Main number at [phone]. 

## 2021-05-19 NOTE — Anesthesia Procedure Notes (Signed)
Procedure Name: LMA Insertion Date/Time: 05/19/2021 9:14 AM Performed by: Rolla Plate, CRNA Pre-anesthesia Checklist: Patient identified, Patient being monitored, Timeout performed, Emergency Drugs available and Suction available Patient Re-evaluated:Patient Re-evaluated prior to induction Oxygen Delivery Method: Circle system utilized Preoxygenation: Pre-oxygenation with 100% oxygen Induction Type: IV induction Ventilation: Mask ventilation without difficulty LMA: LMA inserted LMA Size: 3.0 Tube type: Oral Number of attempts: 1 Placement Confirmation: positive ETCO2 and breath sounds checked- equal and bilateral Tube secured with: Tape Dental Injury: Teeth and Oropharynx as per pre-operative assessment

## 2021-05-25 DIAGNOSIS — M2011 Hallux valgus (acquired), right foot: Secondary | ICD-10-CM | POA: Diagnosis not present

## 2021-06-27 ENCOUNTER — Other Ambulatory Visit: Payer: Self-pay | Admitting: Family

## 2021-06-27 DIAGNOSIS — F411 Generalized anxiety disorder: Secondary | ICD-10-CM

## 2021-07-04 ENCOUNTER — Other Ambulatory Visit (HOSPITAL_COMMUNITY): Payer: Self-pay | Admitting: Podiatry

## 2021-07-04 ENCOUNTER — Other Ambulatory Visit: Payer: Self-pay | Admitting: Podiatry

## 2021-07-04 ENCOUNTER — Ambulatory Visit
Admission: RE | Admit: 2021-07-04 | Discharge: 2021-07-04 | Disposition: A | Discharge status: Home / Self Care | Payer: BC Managed Care – PPO | Source: Ambulatory Visit | Attending: Podiatry | Admitting: Podiatry

## 2021-07-04 ENCOUNTER — Other Ambulatory Visit: Payer: Self-pay

## 2021-07-04 DIAGNOSIS — M79661 Pain in right lower leg: Secondary | ICD-10-CM | POA: Insufficient documentation

## 2021-07-04 DIAGNOSIS — M2011 Hallux valgus (acquired), right foot: Secondary | ICD-10-CM | POA: Diagnosis not present

## 2021-07-04 DIAGNOSIS — M7989 Other specified soft tissue disorders: Secondary | ICD-10-CM | POA: Diagnosis not present

## 2021-07-04 DIAGNOSIS — M79604 Pain in right leg: Secondary | ICD-10-CM | POA: Diagnosis not present

## 2021-07-06 ENCOUNTER — Telehealth: Payer: Self-pay | Admitting: Family

## 2021-07-06 ENCOUNTER — Telehealth: Payer: Self-pay

## 2021-07-06 DIAGNOSIS — F411 Generalized anxiety disorder: Secondary | ICD-10-CM

## 2021-07-06 NOTE — Telephone Encounter (Signed)
Patient needs a refill on her traZODone (DESYREL) 50 MG tablet. Please send to [facility] in [place]

## 2021-07-06 NOTE — Telephone Encounter (Signed)
Called to speak with Tracey [name] to discuss her medication. Pt states that she understands that there is a year supply at [facility] but that she is unable to get her medication. Pt was concerned and asked me to contact the pharmacy. I have contacted the pharmacy and spoke with [facility]. Tracey [name] states that Tracey [name] picked up the medication on July 18th and that the system put her next refill on 07/15/21. Tracey [name] was concerned on how she is out of medication at this time.   Called to speak with Tracey [name]. Tracey [name] verbalized understanding and states that she was unaware that there was a date for her to pick up her medication. Tracey [name] states that she was told by Tracey [name] that she was supposed to take 2 a night instead of one. She asks if we can make Tracey [name] aware so that we can send in a corrected prescription. Ok to change prescription to 2 tablets at bedtime?

## 2021-07-07 ENCOUNTER — Other Ambulatory Visit: Payer: Self-pay

## 2021-07-07 DIAGNOSIS — M545 Low back pain, unspecified: Secondary | ICD-10-CM

## 2021-07-07 MED ORDER — GABAPENTIN 300 MG PO CAPS: 600.0000 mg | ORAL_CAPSULE | Freq: Every day | ORAL | 0 refills | Status: DC

## 2021-07-07 MED ORDER — TRAZODONE HCL 50 MG PO TABS: 100.0000 mg | ORAL_TABLET | Freq: Every day | ORAL | 3 refills | Status: DC

## 2021-07-07 NOTE — Telephone Encounter (Signed)
Addressed in another note

## 2021-07-07 NOTE — Telephone Encounter (Signed)
Pt called and notified that new prescription was sent. She also inquired about increase in Gabapentin at night & she is out of this medication. Message has been sent do Dr. Caryl Bis to review sine Joycelyn Schmid out of the office.

## 2021-07-07 NOTE — Telephone Encounter (Signed)
LMTCB to advise on below.

## 2021-07-07 NOTE — Telephone Encounter (Signed)
Call patient That is fine for her to take trazodone 100 mg. That will be 2 5- mg tablets taken in the evening.  I have sent a new prescription

## 2021-07-07 NOTE — Telephone Encounter (Signed)
I sent the refill in for you to take 600 mg daily at bedtime though she needs to discuss the ongoing nature of this dose with her PCP.  I will forward to her PCP as well.

## 2021-09-07 DIAGNOSIS — M2011 Hallux valgus (acquired), right foot: Secondary | ICD-10-CM | POA: Diagnosis not present

## 2021-09-26 ENCOUNTER — Encounter: Payer: Self-pay | Admitting: Podiatry

## 2021-10-03 ENCOUNTER — Other Ambulatory Visit: Payer: Self-pay | Admitting: Podiatry

## 2021-10-03 DIAGNOSIS — M2022 Hallux rigidus, left foot: Secondary | ICD-10-CM | POA: Diagnosis not present

## 2021-10-05 NOTE — Discharge Instructions (Signed)
[facility]  POST OPERATIVE INSTRUCTIONS FOR DR. FOWLER AND [facility] POD[facility]   Take your medication as prescribed.  Pain medication should be taken only as needed.  Keep the dressing clean, dry and intact.  Keep your foot elevated above the heart level for the first 48 hours.  Walking to the bathroom and brief periods of walking are acceptable, unless we have instructed you to be non-weight bearing.  Always wear your post-op shoe when walking.  Always use your crutches if you are to be non-weight bearing.  Do not take a shower. Baths are permissible as long as the foot is kept out of the water.   Every hour you are awake:  Bend your knee 15 times. Flex foot 15 times Massage calf 15 times  Call [facility] ([phone]) if any of the following problems occur: You develop a temperature or fever. The bandage becomes saturated with blood. Medication does not stop your pain. Injury of the foot occurs. Any symptoms of infection including redness, odor, or red streaks running from wound. 

## 2021-10-11 ENCOUNTER — Ambulatory Visit
Admission: RE | Admit: 2021-10-11 | Discharge: 2021-10-11 | Disposition: A | Discharge status: Home / Self Care | Payer: BC Managed Care – PPO | Attending: Podiatry | Admitting: Podiatry

## 2021-10-11 ENCOUNTER — Ambulatory Visit: Payer: BC Managed Care – PPO | Admitting: Anesthesiology

## 2021-10-11 ENCOUNTER — Ambulatory Visit: Payer: Self-pay

## 2021-10-11 ENCOUNTER — Encounter
Admission: RE | Disposition: A | Discharge status: Home / Self Care | Payer: Self-pay | Source: Home / Self Care | Attending: Podiatry

## 2021-10-11 ENCOUNTER — Other Ambulatory Visit: Payer: Self-pay

## 2021-10-11 ENCOUNTER — Encounter: Payer: Self-pay | Admitting: Podiatry

## 2021-10-11 DIAGNOSIS — M2022 Hallux rigidus, left foot: Secondary | ICD-10-CM | POA: Diagnosis not present

## 2021-10-11 DIAGNOSIS — F419 Anxiety disorder, unspecified: Secondary | ICD-10-CM | POA: Insufficient documentation

## 2021-10-11 DIAGNOSIS — R519 Headache, unspecified: Secondary | ICD-10-CM | POA: Diagnosis not present

## 2021-10-11 HISTORY — DX: Other specified postprocedural states: Z98.890

## 2021-10-11 HISTORY — DX: Other specified postprocedural states: R11.2

## 2021-10-11 HISTORY — PX: ARTHRODESIS METATARSALPHALANGEAL JOINT (MTPJ): SHX6566

## 2021-10-11 LAB — POCT PREGNANCY, URINE: Preg Test, Ur: NEGATIVE

## 2021-10-11 SURGERY — FUSION, JOINT, GREAT TOE
Anesthesia: General | Site: Toe | Laterality: Left

## 2021-10-11 MED ORDER — BUPIVACAINE HCL (PF) 0.25 % IJ SOLN
INTRAMUSCULAR | Status: DC | PRN
  Administered 2021-10-11: 10 mL

## 2021-10-11 MED ORDER — LIDOCAINE HCL (CARDIAC) PF 100 MG/5ML IV SOSY
PREFILLED_SYRINGE | INTRAVENOUS | Status: DC | PRN
  Administered 2021-10-11: 50 mg via INTRATRACHEAL

## 2021-10-11 MED ORDER — ONDANSETRON HCL 4 MG/2ML IJ SOLN
4.0000 mg | Freq: Once | INTRAMUSCULAR | Status: AC | PRN
  Administered 2021-10-11: 4 mg via INTRAVENOUS

## 2021-10-11 MED ORDER — METOCLOPRAMIDE HCL 5 MG/ML IJ SOLN: 5.0000 mg | Freq: Three times a day (TID) | INTRAMUSCULAR | Status: DC | PRN

## 2021-10-11 MED ORDER — CEFAZOLIN SODIUM-DEXTROSE 2-4 GM/100ML-% IV SOLN
2.0000 g | INTRAVENOUS | Status: AC
  Administered 2021-10-11: 2 g via INTRAVENOUS

## 2021-10-11 MED ORDER — FENTANYL CITRATE (PF) 100 MCG/2ML IJ SOLN
INTRAMUSCULAR | Status: DC | PRN
  Administered 2021-10-11: 50 ug via INTRAVENOUS

## 2021-10-11 MED ORDER — SODIUM CHLORIDE 0.9 % IR SOLN
Status: DC | PRN
  Administered 2021-10-11: 1

## 2021-10-11 MED ORDER — OXYCODONE HCL 5 MG PO TABS: 5.0000 mg | ORAL_TABLET | Freq: Once | ORAL | Status: DC | PRN

## 2021-10-11 MED ORDER — ONDANSETRON HCL 4 MG/2ML IJ SOLN: 4.0000 mg | Freq: Four times a day (QID) | INTRAMUSCULAR | Status: DC | PRN

## 2021-10-11 MED ORDER — DEXAMETHASONE SODIUM PHOSPHATE 4 MG/ML IJ SOLN
INTRAMUSCULAR | Status: DC | PRN
  Administered 2021-10-11: 8 mg via INTRAVENOUS

## 2021-10-11 MED ORDER — FENTANYL CITRATE PF 50 MCG/ML IJ SOSY: 25.0000 ug | PREFILLED_SYRINGE | INTRAMUSCULAR | Status: DC | PRN

## 2021-10-11 MED ORDER — ONDANSETRON HCL 4 MG PO TABS: 4.0000 mg | ORAL_TABLET | Freq: Four times a day (QID) | ORAL | Status: DC | PRN

## 2021-10-11 MED ORDER — OXYCODONE HCL 5 MG/5ML PO SOLN
5.0000 mg | Freq: Once | ORAL | Status: DC | PRN
Start: 2021-10-11 — End: 2021-10-11

## 2021-10-11 MED ORDER — BUPIVACAINE LIPOSOME 1.3 % IJ SUSP
INTRAMUSCULAR | Status: DC | PRN
  Administered 2021-10-11 (×2): 10 mL

## 2021-10-11 MED ORDER — EPHEDRINE SULFATE 50 MG/ML IJ SOLN
INTRAMUSCULAR | Status: DC | PRN
Start: 2021-10-11 — End: 2021-10-11
  Administered 2021-10-11: 5 mg via INTRAVENOUS
  Administered 2021-10-11: 10 mg via INTRAVENOUS
  Administered 2021-10-11 (×4): 5 mg via INTRAVENOUS

## 2021-10-11 MED ORDER — PROPOFOL 10 MG/ML IV BOLUS
INTRAVENOUS | Status: DC | PRN
Start: 2021-10-11 — End: 2021-10-11
  Administered 2021-10-11: 150 mg via INTRAVENOUS

## 2021-10-11 MED ORDER — METOCLOPRAMIDE HCL 5 MG PO TABS: 5.0000 mg | ORAL_TABLET | Freq: Three times a day (TID) | ORAL | Status: DC | PRN

## 2021-10-11 MED ORDER — GLYCOPYRROLATE 0.2 MG/ML IJ SOLN
INTRAMUSCULAR | Status: DC | PRN
  Administered 2021-10-11: .1 mg via INTRAVENOUS

## 2021-10-11 MED ORDER — MIDAZOLAM HCL 5 MG/5ML IJ SOLN
INTRAMUSCULAR | Status: DC | PRN
  Administered 2021-10-11: 2 mg via INTRAVENOUS

## 2021-10-11 MED ORDER — LACTATED RINGERS IV SOLN: INTRAVENOUS | Status: DC

## 2021-10-11 SURGICAL SUPPLY — 45 items
APL SKNCLS STERI-STRIP NONHPOA (GAUZE/BANDAGES/DRESSINGS) ×1
BENZOIN TINCTURE PRP APPL 2/3 (GAUZE/BANDAGES/DRESSINGS) ×2 IMPLANT
BIT DRILL STRM CALB 2.0ST W/SL (BIT) IMPLANT
BNDG CMPR 75X41 PLY HI ABS (GAUZE/BANDAGES/DRESSINGS) ×1
BNDG COHESIVE 4X5 TAN ST LF (GAUZE/BANDAGES/DRESSINGS) ×2 IMPLANT
BNDG ELASTIC 4X5.8 VLCR STR LF (GAUZE/BANDAGES/DRESSINGS) ×2 IMPLANT
BNDG ESMARK 4X12 TAN STRL LF (GAUZE/BANDAGES/DRESSINGS) ×2 IMPLANT
BNDG GAUZE ELAST 4 BULKY (GAUZE/BANDAGES/DRESSINGS) ×2 IMPLANT
BNDG STRETCH 4X75 STRL LF (GAUZE/BANDAGES/DRESSINGS) ×2 IMPLANT
BOOT STEPPER DURA SM (SOFTGOODS) ×1 IMPLANT
CANISTER SUCT 1200ML W/VALVE (MISCELLANEOUS) ×2 IMPLANT
COVER LIGHT HANDLE UNIVERSAL (MISCELLANEOUS) ×4 IMPLANT
DRAPE FLUOR MINI C-ARM 54X84 (DRAPES) ×2 IMPLANT
DRILL STRM CALIB 2.0 ST W/SL (BIT) ×2
DURAPREP 26ML APPLICATOR (WOUND CARE) ×2 IMPLANT
ELECT REM PT RETURN 9FT ADLT (ELECTROSURGICAL) ×2
ELECTRODE REM PT RTRN 9FT ADLT (ELECTROSURGICAL) ×1 IMPLANT
GAUZE SPONGE 4X4 12PLY STRL (GAUZE/BANDAGES/DRESSINGS) ×2 IMPLANT
GAUZE XEROFORM 1X8 LF (GAUZE/BANDAGES/DRESSINGS) ×2 IMPLANT
GLOVE SRG 8 PF TXTR STRL LF DI (GLOVE) ×2 IMPLANT
GLOVE SURG ENC MOIS LTX SZ7.5 (GLOVE) ×4 IMPLANT
GLOVE SURG UNDER POLY LF SZ8 (GLOVE) ×4
GOWN STRL REUS W/ TWL LRG LVL3 (GOWN DISPOSABLE) ×2 IMPLANT
GOWN STRL REUS W/TWL LRG LVL3 (GOWN DISPOSABLE) ×4
K-WIRE ACE 1.6X6 (WIRE) ×6
K-WIRE DBL END TROCAR 6X.062 (WIRE) ×2
KIT INSTRUMENT MPJ STRATUM (KITS) ×1 IMPLANT
KIT TURNOVER KIT A (KITS) ×2 IMPLANT
KWIRE ACE 1.6X6 (WIRE) IMPLANT
KWIRE DBL END TROCAR 6X.062 (WIRE) IMPLANT
NS IRRIG 500ML POUR BTL (IV SOLUTION) ×2 IMPLANT
PACK EXTREMITY ARMC (MISCELLANEOUS) ×2 IMPLANT
PLATE MPJ 1ST STRM SM 0D LT (Plate) ×1 IMPLANT
RASP SM TEAR CROSS CUT (RASP) ×1 IMPLANT
SCREW LOCK ST STRM 2.7X14 (Screw) ×1 IMPLANT
SCREW LOCK ST STRM 2.7X16 (Screw) ×3 IMPLANT
SCREW STRM LOCK 2.7X12 ST (Screw) ×1 IMPLANT
STOCKINETTE IMPERVIOUS LG (DRAPES) ×2 IMPLANT
STRIP CLOSURE SKIN 1/4X4 (GAUZE/BANDAGES/DRESSINGS) ×2 IMPLANT
SUT MNCRL 4-0 (SUTURE) ×2
SUT MNCRL 4-0 27XMFL (SUTURE) ×1
SUT VIC AB 3-0 SH 27 (SUTURE) ×2
SUT VIC AB 3-0 SH 27X BRD (SUTURE) IMPLANT
SUT VIC AB 4-0 FS2 27 (SUTURE) ×1 IMPLANT
SUTURE MNCRL 4-0 27XMF (SUTURE) IMPLANT

## 2021-10-11 NOTE — Anesthesia Preprocedure Evaluation (Signed)
Anesthesia Evaluation  Patient identified by MRN, date of birth, ID band Patient awake    Reviewed: Allergy & Precautions, H&P , NPO status , Patient's Chart, lab work & pertinent test results  History of Anesthesia Complications (+) PONV and history of anesthetic complications  Airway Mallampati: I  TM Distance: >3 FB Neck ROM: full    Dental no notable dental hx.    Pulmonary neg pulmonary ROS,    Pulmonary exam normal        Cardiovascular negative cardio ROS Normal cardiovascular exam Rhythm:regular Rate:Normal     Neuro/Psych  Headaches, Anxiety    GI/Hepatic negative GI ROS, Neg liver ROS,   Endo/Other  negative endocrine ROS  Renal/GU negative Renal ROS     Musculoskeletal   Abdominal   Peds  Hematology negative hematology ROS (+)   Anesthesia Other Findings   Reproductive/Obstetrics                             Anesthesia Physical Anesthesia Plan  ASA: 2  Anesthesia Plan: General LMA   Post-op Pain Management:    Induction:   PONV Risk Score and Plan: 4 or greater and Ondansetron, Dexamethasone, Midazolam and Treatment may vary due to age or medical condition  Airway Management Planned:   Additional Equipment:   Intra-op Plan:   Post-operative Plan:   Informed Consent: I have reviewed the patients History and Physical, chart, labs and discussed the procedure including the risks, benefits and alternatives for the proposed anesthesia with the patient or authorized representative who has indicated his/her understanding and acceptance.       Plan Discussed with:   Anesthesia Plan Comments:         Anesthesia Quick Evaluation

## 2021-10-11 NOTE — H&P (Signed)
HISTORY AND PHYSICAL INTERVAL NOTE:  10/11/2021  1:28 PM  Tracey [name]  has presented today for surgery, with the diagnosis of M20.22 - Hallux rigidus of left foot.  The various methods of treatment have been discussed with the patient.  No guarantees were given.  After consideration of risks, benefits and other options for treatment, the patient has consented to surgery.  I have reviewed the patients chart and labs.     [name] history and physical examination was performed in my office.  The patient was reexamined.  There have been no changes to this history and physical examination.  Tracey [name]

## 2021-10-11 NOTE — Transfer of Care (Signed)
Immediate Anesthesia Transfer of Care Note  Patient: Tracey [name]  Procedure(s) Performed: ARTHRODESIS METATARSALPHALANGEAL JOINT (MTPJ) (Left: Toe)  Patient Location: PACU  Anesthesia Type: General LMA  Level of Consciousness: awake, alert  and patient cooperative  Airway and Oxygen Therapy: Patient Spontanous Breathing and Patient connected to supplemental oxygen  Post-op Assessment: Post-op Vital signs reviewed, Patient's Cardiovascular Status Stable, Respiratory Function Stable, Patent Airway and No signs of Nausea or vomiting  Post-op Vital Signs: Reviewed and stable  Complications: No notable events documented.

## 2021-10-11 NOTE — Anesthesia Procedure Notes (Signed)
Procedure Name: LMA Insertion Date/Time: 10/11/2021 1:43 PM Performed by: Mayme Genta, CRNA Pre-anesthesia Checklist: Patient identified, Emergency Drugs available, Suction available, Timeout performed and Patient being monitored Patient Re-evaluated:Patient Re-evaluated prior to induction Oxygen Delivery Method: Circle system utilized Preoxygenation: Pre-oxygenation with 100% oxygen Induction Type: IV induction LMA: LMA inserted LMA Size: 4.0 Number of attempts: 1 Placement Confirmation: positive ETCO2 and breath sounds checked- equal and bilateral Tube secured with: Tape

## 2021-10-11 NOTE — Anesthesia Postprocedure Evaluation (Signed)
Anesthesia Post Note  Patient: Tracey [name]  Procedure(s) Performed: ARTHRODESIS METATARSALPHALANGEAL JOINT (MTPJ) (Left: Toe)     Patient location during evaluation: PACU Anesthesia Type: General Level of consciousness: awake and alert Pain management: pain level controlled Vital Signs Assessment: post-procedure vital signs reviewed and stable Respiratory status: spontaneous breathing Cardiovascular status: stable Anesthetic complications: no   No notable events documented.  Gillian Scarce

## 2021-10-11 NOTE — Op Note (Signed)
Operative note   Surgeon:Careena Degraffenreid Lawyer: None    Preop diagnosis: Hallux rigidus left first MTPJ    Postop diagnosis: Same    Procedure: Arthrodesis left first MTPJ with Biomet stratum locking plate    EBL: Minimal    Anesthesia:local and general.  Local consisted of a total of 10 cc of 0.25% bupivacaine and 20 cc of Exparel long-acting anesthetic    Hemostasis: Ankle tourniquet inflated to 200 mmHg for approximately 70 minutes    Specimen: None    Complications: None    Operative indications:Tracey [name] is an 51 y.o. that presents today for surgical intervention.  The risks/benefits/alternatives/complications have been discussed and consent has been given.    Procedure:  Patient was brought into the OR and placed on the operating table in thesupine position. After anesthesia was obtained theleft lower extremity was prepped and draped in usual sterile fashion.  Attention was directed to the dorsomedial left first MTPJ where an incision was performed.  Sharp and blunt dissection carried down to the capsule.  A longitudinal capsulotomy was performed.  This time the head of the proximal phalanx and base of the middle phalanx were exposed.  Minimal articular cartilage was noted on either side.  At this time with a cup and cone reamer I was able to remove residual articular cartilage down to the subchondral bone plate.  This was then flushed.  This was then drilled with a 2.0 mm drill bit for preparation.  Next the toe was held in a neutral position.  The Biomet stratum template for the small left 0 degree first MTPJ fusion was placed.  Next using standard technique with the compression slide of the plate I was able to realign the great toe joint and the plate was then fixated to the MTPJ with 2.7 mm locking screws.  Excellent realignment and compression was noted.  Good stability was noted.  The wound was flushed with copious amounts of irrigation.  Closure was then  performed with a 3-0 Vicryl the deeper layer, 4-0 Vicryl for the subcutaneous tissue and 5-0 Monocryl for the skin.    Patient tolerated the procedure and anesthesia well.  Was transported from the OR to the PACU with all vital signs stable and vascular status intact. To be discharged per routine protocol.  Will follow up in approximately 1 week in the outpatient clinic.

## 2021-10-12 ENCOUNTER — Encounter: Payer: Self-pay | Admitting: Podiatry

## 2021-10-17 DIAGNOSIS — M2022 Hallux rigidus, left foot: Secondary | ICD-10-CM | POA: Diagnosis not present

## 2021-11-21 DIAGNOSIS — M2022 Hallux rigidus, left foot: Secondary | ICD-10-CM | POA: Diagnosis not present

## 2021-11-28 ENCOUNTER — Telehealth: Payer: Self-pay | Admitting: Family

## 2021-11-28 DIAGNOSIS — J309 Allergic rhinitis, unspecified: Secondary | ICD-10-CM

## 2021-11-28 NOTE — Telephone Encounter (Signed)
Pt called in requesting for Zpack or some type of medication for her sinus infection. Pt stated that she lives to far to come for an appt. Pt requesting callback

## 2021-11-28 NOTE — Telephone Encounter (Signed)
I advised patient that unfortunately a visit was required for this. Due to patient just coming back to work from surgery I suggested the e-visit or mychart virtual UC option. Pt did not want to miss work. She did ask if I could refill her Singulair the was d/c off her chart. She feels that she should be taking but had stopped due to insurance not covering last year.

## 2021-11-29 MED ORDER — MONTELUKAST SODIUM 10 MG PO TABS: 10.0000 mg | ORAL_TABLET | Freq: Every day | ORAL | 3 refills | Status: DC

## 2021-11-29 NOTE — Telephone Encounter (Signed)
I spoke with patient & he is scheduled for annual exam in August. ?

## 2021-11-29 NOTE — Telephone Encounter (Signed)
Call patient ?Singulair has been refilled.  please emphasize that she will need at least annual follow-up for medication refills.  She is due in August. ?Yes, any concerns for infection, please advise visit with me.  Otherwise she needs to consider urgent care ? ?

## 2021-12-29 DIAGNOSIS — M5416 Radiculopathy, lumbar region: Secondary | ICD-10-CM | POA: Diagnosis not present

## 2022-01-16 DIAGNOSIS — M79672 Pain in left foot: Secondary | ICD-10-CM | POA: Diagnosis not present

## 2022-01-25 ENCOUNTER — Other Ambulatory Visit: Payer: Self-pay | Admitting: Family

## 2022-01-25 DIAGNOSIS — F411 Generalized anxiety disorder: Secondary | ICD-10-CM

## 2022-02-12 DIAGNOSIS — M84375A Stress fracture, left foot, initial encounter for fracture: Secondary | ICD-10-CM | POA: Diagnosis not present

## 2022-02-12 DIAGNOSIS — M79672 Pain in left foot: Secondary | ICD-10-CM | POA: Diagnosis not present

## 2022-02-12 DIAGNOSIS — M2022 Hallux rigidus, left foot: Secondary | ICD-10-CM | POA: Diagnosis not present

## 2022-02-12 DIAGNOSIS — M7672 Peroneal tendinitis, left leg: Secondary | ICD-10-CM | POA: Diagnosis not present

## 2022-02-19 ENCOUNTER — Other Ambulatory Visit: Payer: Self-pay | Admitting: Obstetrics and Gynecology

## 2022-02-19 DIAGNOSIS — Z1231 Encounter for screening mammogram for malignant neoplasm of breast: Secondary | ICD-10-CM

## 2022-03-06 DIAGNOSIS — M84375D Stress fracture, left foot, subsequent encounter for fracture with routine healing: Secondary | ICD-10-CM | POA: Diagnosis not present

## 2022-03-06 DIAGNOSIS — M79672 Pain in left foot: Secondary | ICD-10-CM | POA: Diagnosis not present

## 2022-03-27 DIAGNOSIS — M84375D Stress fracture, left foot, subsequent encounter for fracture with routine healing: Secondary | ICD-10-CM | POA: Diagnosis not present

## 2022-03-27 DIAGNOSIS — M2022 Hallux rigidus, left foot: Secondary | ICD-10-CM | POA: Diagnosis not present

## 2022-03-27 DIAGNOSIS — Z9889 Other specified postprocedural states: Secondary | ICD-10-CM | POA: Diagnosis not present

## 2022-03-27 DIAGNOSIS — M79672 Pain in left foot: Secondary | ICD-10-CM | POA: Diagnosis not present

## 2022-04-10 ENCOUNTER — Other Ambulatory Visit: Payer: Self-pay | Admitting: Family

## 2022-04-10 DIAGNOSIS — F411 Generalized anxiety disorder: Secondary | ICD-10-CM

## 2022-04-10 DIAGNOSIS — M545 Low back pain, unspecified: Secondary | ICD-10-CM

## 2022-04-12 ENCOUNTER — Ambulatory Visit
Admission: RE | Admit: 2022-04-12 | Discharge: 2022-04-12 | Disposition: A | Discharge status: Home / Self Care | Payer: BC Managed Care – PPO | Source: Ambulatory Visit | Attending: Obstetrics and Gynecology | Admitting: Obstetrics and Gynecology

## 2022-04-12 DIAGNOSIS — R4586 Emotional lability: Secondary | ICD-10-CM | POA: Diagnosis not present

## 2022-04-12 DIAGNOSIS — Z1331 Encounter for screening for depression: Secondary | ICD-10-CM | POA: Diagnosis not present

## 2022-04-12 DIAGNOSIS — G479 Sleep disorder, unspecified: Secondary | ICD-10-CM | POA: Diagnosis not present

## 2022-04-12 DIAGNOSIS — R5383 Other fatigue: Secondary | ICD-10-CM | POA: Diagnosis not present

## 2022-04-12 DIAGNOSIS — Z01419 Encounter for gynecological examination (general) (routine) without abnormal findings: Secondary | ICD-10-CM | POA: Diagnosis not present

## 2022-04-12 DIAGNOSIS — Z1231 Encounter for screening mammogram for malignant neoplasm of breast: Secondary | ICD-10-CM

## 2022-04-12 DIAGNOSIS — N951 Menopausal and female climacteric states: Secondary | ICD-10-CM | POA: Diagnosis not present

## 2022-05-07 ENCOUNTER — Other Ambulatory Visit: Payer: Self-pay

## 2022-05-07 DIAGNOSIS — F411 Generalized anxiety disorder: Secondary | ICD-10-CM

## 2022-05-07 DIAGNOSIS — M545 Low back pain, unspecified: Secondary | ICD-10-CM

## 2022-05-07 MED ORDER — BUPROPION HCL ER (XL) 150 MG PO TB24: 300.0000 mg | ORAL_TABLET | Freq: Every morning | ORAL | 3 refills | Status: DC

## 2022-05-07 MED ORDER — TRAZODONE HCL 50 MG PO TABS: ORAL_TABLET | ORAL | 3 refills | Status: DC

## 2022-05-07 MED ORDER — GABAPENTIN 300 MG PO CAPS: 300.0000 mg | ORAL_CAPSULE | Freq: Every day | ORAL | 3 refills | Status: DC

## 2022-05-11 ENCOUNTER — Encounter: Payer: BC Managed Care – PPO | Admitting: Family

## 2022-05-29 ENCOUNTER — Encounter: Payer: BC Managed Care – PPO | Admitting: Family

## 2022-07-04 ENCOUNTER — Encounter: Payer: Self-pay | Admitting: Family

## 2022-07-04 ENCOUNTER — Ambulatory Visit (INDEPENDENT_AMBULATORY_CARE_PROVIDER_SITE_OTHER): Payer: BC Managed Care – PPO | Admitting: Family

## 2022-07-04 VITALS — BP 116/76 | HR 68 | Temp 98.2°F | Ht 62.0 in | Wt 137.4 lb

## 2022-07-04 DIAGNOSIS — F411 Generalized anxiety disorder: Secondary | ICD-10-CM

## 2022-07-04 DIAGNOSIS — G8929 Other chronic pain: Secondary | ICD-10-CM

## 2022-07-04 DIAGNOSIS — Z1211 Encounter for screening for malignant neoplasm of colon: Secondary | ICD-10-CM | POA: Diagnosis not present

## 2022-07-04 DIAGNOSIS — M545 Low back pain, unspecified: Secondary | ICD-10-CM | POA: Diagnosis not present

## 2022-07-04 MED ORDER — BUPROPION HCL ER (XL) 300 MG PO TB24: 300.0000 mg | ORAL_TABLET | Freq: Every morning | ORAL | 1 refills | Status: DC

## 2022-07-04 NOTE — Patient Instructions (Addendum)
I placed referral to [place] [phone] for colonoscopy  Let us know if you dont hear back within a week in regards to an appointment being scheduled.    Nice to see you!

## 2022-07-04 NOTE — Progress Notes (Signed)
Subjective:    Patient ID: Tracey [name], female    DOB: [birth year], 51 y.o.   MRN: [id number]  CC: Tracey [name] is a 51 y.o. female who presents today for follow up    HPI: Feels well today.  No new complaints    She takes gabapentin '300mg'$  qhs prn for low back pain which is helpful.   She is compliant with trazodone '100mg'$ , wellbutrin '300mg'$  qam. Rare use of buspar '10mg'$  for anxiety. She is sleeping well.  She feels regimen is working well for her  Allergies- compliant with zyrtec 10nmg, singulair '10mg'$  daily.   She follows with GYN whom she has had CPE with this year.    HISTORY:  Past Medical History:  Diagnosis Date   Allergy    Seasonal   Anxiety    Arthritis    Cancer ([place]) skin   basal cell neck   Complication of anesthesia    Pt was very aggressive and agitated after back surgery-developed ileus s after back surgery   Depression    Frequent headaches    migraines   Ileus ([place])    developed after back surgery   PONV (postoperative nausea and vomiting)     Past Surgical History:  Procedure Laterality Date   ARTHRODESIS METATARSALPHALANGEAL JOINT (MTPJ) Bilateral 05/19/2021   Procedure: RIGHT ARTHRODESIS METATARSALPHALANGEAL JOINT (MTPJ) , LEFT GREAT TOE INJECTION;  Surgeon: Samara Deist, DPM;  Location: [facility];  Service: Podiatry;  Laterality: Bilateral;   ARTHRODESIS METATARSALPHALANGEAL JOINT (MTPJ) Left 10/11/2021   Procedure: ARTHRODESIS METATARSALPHALANGEAL JOINT (MTPJ);  Surgeon: Samara Deist, DPM;  Location: [place];  Service: Podiatry;  Laterality: Left;   BACK SURGERY  12/31/2018   Dr Dimmig; rod and screw   ENDOMETRIAL ABLATION     FOOT SURGERY Left    TUBAL LIGATION  2013   WISDOM TOOTH EXTRACTION     Family History  Problem Relation Age of Onset   Breast cancer Paternal Grandmother    Diabetes Paternal Grandmother    Other Mother        accidental death   Cancer Father        brain tumor   Melanoma Father     Lung cancer Father    Alcohol abuse Brother    Drug abuse Brother    Anxiety disorder Brother    Depression Brother       ALLERGIES: Molds & smuts  Current Outpatient Medications on File Prior to Visit  Medication Sig Dispense Refill   busPIRone (BUSPAR) 10 MG tablet TAKE (1) TABLET BY MOUTH DAILY AT BEDTIME 90 tablet 1   gabapentin (NEURONTIN) 300 MG capsule Take 1 capsule (300 mg total) by mouth at bedtime. 90 capsule 3   montelukast (SINGULAIR) 10 MG tablet Take 1 tablet (10 mg total) by mouth at bedtime. 90 tablet 3   Multiple Vitamins-Minerals (MULTIVITAMIN WITH MINERALS) tablet Take 1 tablet by mouth daily.     traZODone (DESYREL) 50 MG tablet TAKE (2) TABLETS BY MOUTH EVERY NIGHT ATBEDTIME 120 tablet 3   cetirizine (ZYRTEC) 10 MG tablet Take 10 mg by mouth daily as needed for allergies. (Patient not taking: Reported on 07/04/2022)     No current facility-administered medications on file prior to visit.    Social History   Tobacco Use   Smoking status: Never   Smokeless tobacco: Never  Vaping Use   Vaping Use: Never used  Substance Use Topics   Alcohol use: Not Currently    Alcohol/week:  1.0 standard drink of alcohol    Types: 1 Glasses of wine per week    Comment: wine rarely   Drug use: No    Review of Systems  Constitutional:  Negative for chills and fever.  Respiratory:  Negative for cough.   Cardiovascular:  Negative for chest pain and palpitations.  Gastrointestinal:  Negative for nausea and vomiting.  Psychiatric/Behavioral:  Negative for sleep disturbance.       Objective:    BP 116/76 (BP Location: Left Arm, Patient Position: Sitting, Cuff Size: Normal)   Pulse 68   Temp 98.2 F (36.8 C) (Oral)   Ht '5\' 2"'$  (1.575 m)   Wt 137 lb 6.4 oz (62.3 kg)   LMP  (LMP Unknown)   SpO2 99%   BMI 25.13 kg/m   BP Readings from Last 3 Encounters:  07/04/22 116/76  10/11/21 123/75  05/19/21 126/88   Wt Readings from Last 3 Encounters:  07/04/22 137 lb 6.4 oz  (62.3 kg)  10/11/21 135 lb (61.2 kg)  05/19/21 130 lb (59 kg)    Physical Exam Vitals reviewed.  Constitutional:      Appearance: She is well-developed.  Eyes:     Conjunctiva/sclera: Conjunctivae normal.  Cardiovascular:     Rate and Rhythm: Normal rate and regular rhythm.     Pulses: Normal pulses.     Heart sounds: Normal heart sounds.  Pulmonary:     Effort: Pulmonary effort is normal.     Breath sounds: Normal breath sounds. No wheezing, rhonchi or rales.  Skin:    General: Skin is warm and dry.  Neurological:     Mental Status: She is alert.  Psychiatric:        Speech: Speech normal.        Behavior: Behavior normal.        Thought Content: Thought content normal.        Assessment & Plan:   Problem List Items Addressed This Visit       Other   GAD (generalized anxiety disorder)    Chronic, stable.  Continue  trazodone '100mg'$ , wellbutrin '300mg'$  qam, buspar '10mg'$  prn.       Relevant Medications   buPROPion (WELLBUTRIN XL) 300 MG 24 hr tablet   Low back pain    Chronic, stable.  Continue gabapentin 300 mg nightly as needed.      Other Visit Diagnoses     Screen for colon cancer    -  Primary   Relevant Orders   Ambulatory referral to Gastroenterology        I have discontinued Clent [place]ges. [place]'s cholecalciferol, ibuprofen, and oxyCODONE-acetaminophen. I have also changed her buPROPion. Additionally, I am having her maintain her multivitamin with minerals, cetirizine, montelukast, busPIRone, gabapentin, and traZODone.   Meds ordered this encounter  Medications   buPROPion (WELLBUTRIN XL) 300 MG 24 hr tablet    Sig: Take 1 tablet (300 mg total) by mouth every morning.    Dispense:  90 tablet    Refill:  1    Order Specific Question:   Supervising Provider    Answer:   Crecencio Mc [[id number]]    Return precautions given.   Risks, benefits, and alternatives of the medications and treatment plan prescribed today were discussed, and patient  expressed understanding.   Education regarding symptom management and diagnosis given to patient on AVS.   Continue to follow with Burnard Hawthorne, FNP for routine health maintenance.   [place] and I  agreed with plan.   Mable Paris, FNP

## 2022-07-04 NOTE — Assessment & Plan Note (Signed)
Chronic, stable.  Continue  trazodone '100mg'$ , wellbutrin '300mg'$  qam, buspar '10mg'$  prn.

## 2022-07-04 NOTE — Assessment & Plan Note (Signed)
Chronic, stable.  Continue gabapentin 300 mg nightly as needed.

## 2022-07-05 ENCOUNTER — Other Ambulatory Visit: Payer: Self-pay

## 2022-07-05 ENCOUNTER — Telehealth: Payer: Self-pay

## 2022-07-05 DIAGNOSIS — Z1211 Encounter for screening for malignant neoplasm of colon: Secondary | ICD-10-CM

## 2022-07-05 MED ORDER — NA SULFATE-K SULFATE-MG SULF 17.5-3.13-1.6 GM/177ML PO SOLN
1.0000 | Freq: Once | ORAL | 0 refills | Status: AC
Start: 2022-07-05 — End: 2022-07-05

## 2022-07-05 NOTE — Telephone Encounter (Signed)
Gastroenterology Pre-Procedure Review  Request Date: 07/26/22 Requesting Physician: Dr. Marius Ditch  PATIENT REVIEW QUESTIONS: The patient responded to the following health history questions as indicated:    1. Are you having any GI issues? no 2. Do you have a personal history of Polyps? no 3. Do you have a family history of Colon Cancer or Polyps?  unsure 4. Diabetes Mellitus? no 5. Joint replacements in the past 12 months? Back surgery 12/2018, foot surgery 2022 and Jan 2023 6. Major health problems in the past 3 months?no 7. Any artificial heart valves, MVP, or defibrillator?no    MEDICATIONS & ALLERGIES:    Patient reports the following regarding taking any anticoagulation/antiplatelet therapy:   Plavix, Coumadin, Eliquis, Xarelto, Lovenox, Pradaxa, Brilinta, or Effient? no Aspirin? no  Patient confirms/reports the following medications:  Current Outpatient Medications  Medication Sig Dispense Refill   buPROPion (WELLBUTRIN XL) 300 MG 24 hr tablet Take 1 tablet (300 mg total) by mouth every morning. 90 tablet 1   busPIRone (BUSPAR) 10 MG tablet TAKE (1) TABLET BY MOUTH DAILY AT BEDTIME 90 tablet 1   cetirizine (ZYRTEC) 10 MG tablet Take 10 mg by mouth daily as needed for allergies. (Patient not taking: Reported on 07/04/2022)     gabapentin (NEURONTIN) 300 MG capsule Take 1 capsule (300 mg total) by mouth at bedtime. 90 capsule 3   montelukast (SINGULAIR) 10 MG tablet Take 1 tablet (10 mg total) by mouth at bedtime. 90 tablet 3   Multiple Vitamins-Minerals (MULTIVITAMIN WITH MINERALS) tablet Take 1 tablet by mouth daily.     traZODone (DESYREL) 50 MG tablet TAKE (2) TABLETS BY MOUTH EVERY NIGHT ATBEDTIME 120 tablet 3   No current facility-administered medications for this visit.    Patient confirms/reports the following allergies:  Allergies  Allergen Reactions   Molds & Smuts     Other reaction(s): Other (See Comments)    No orders of the defined types were placed in this  encounter.   AUTHORIZATION INFORMATION Primary Insurance: 1D#: Group #:  Secondary Insurance: 1D#: Group #:  SCHEDULE INFORMATION: Date:  Time: Location:

## 2022-07-19 ENCOUNTER — Encounter: Payer: Self-pay | Admitting: Gastroenterology

## 2022-07-19 ENCOUNTER — Other Ambulatory Visit: Payer: Self-pay

## 2022-07-26 ENCOUNTER — Ambulatory Visit
Admission: RE | Admit: 2022-07-26 | Discharge: 2022-07-26 | Disposition: A | Discharge status: Home / Self Care | Payer: BC Managed Care – PPO | Attending: Gastroenterology | Admitting: Gastroenterology

## 2022-07-26 ENCOUNTER — Encounter: Payer: Self-pay | Admitting: Gastroenterology

## 2022-07-26 ENCOUNTER — Ambulatory Visit: Payer: BC Managed Care – PPO | Admitting: Anesthesiology

## 2022-07-26 ENCOUNTER — Other Ambulatory Visit: Payer: Self-pay

## 2022-07-26 ENCOUNTER — Encounter
Admission: RE | Disposition: A | Discharge status: Home / Self Care | Payer: Self-pay | Source: Home / Self Care | Attending: Gastroenterology

## 2022-07-26 DIAGNOSIS — F32A Depression, unspecified: Secondary | ICD-10-CM | POA: Diagnosis not present

## 2022-07-26 DIAGNOSIS — F419 Anxiety disorder, unspecified: Secondary | ICD-10-CM | POA: Insufficient documentation

## 2022-07-26 DIAGNOSIS — R109 Unspecified abdominal pain: Secondary | ICD-10-CM | POA: Diagnosis not present

## 2022-07-26 DIAGNOSIS — Z1211 Encounter for screening for malignant neoplasm of colon: Secondary | ICD-10-CM

## 2022-07-26 DIAGNOSIS — F411 Generalized anxiety disorder: Secondary | ICD-10-CM | POA: Diagnosis not present

## 2022-07-26 HISTORY — PX: COLONOSCOPY WITH PROPOFOL: SHX5780

## 2022-07-26 SURGERY — COLONOSCOPY WITH PROPOFOL
Anesthesia: General | Site: Rectum

## 2022-07-26 MED ORDER — PROPOFOL 10 MG/ML IV BOLUS
INTRAVENOUS | Status: DC | PRN
  Administered 2022-07-26: 50 mg via INTRAVENOUS
  Administered 2022-07-26: 100 mg via INTRAVENOUS
  Administered 2022-07-26 (×2): 50 mg via INTRAVENOUS

## 2022-07-26 MED ORDER — SODIUM CHLORIDE 0.9 % IV SOLN: INTRAVENOUS | Status: DC

## 2022-07-26 MED ORDER — STERILE WATER FOR IRRIGATION IR SOLN
Status: DC | PRN
  Administered 2022-07-26: 60 mL
  Administered 2022-07-26: 120 mL

## 2022-07-26 MED ORDER — LIDOCAINE HCL (CARDIAC) PF 100 MG/5ML IV SOSY
PREFILLED_SYRINGE | INTRAVENOUS | Status: DC | PRN
  Administered 2022-07-26: 50 mg via INTRAVENOUS

## 2022-07-26 MED ORDER — LACTATED RINGERS IV SOLN: INTRAVENOUS | Status: DC

## 2022-07-26 MED ORDER — STERILE WATER FOR IRRIGATION IR SOLN
Status: DC | PRN
  Administered 2022-07-26: 150 mL

## 2022-07-26 SURGICAL SUPPLY — 25 items

## 2022-07-26 NOTE — Transfer of Care (Signed)
Immediate Anesthesia Transfer of Care Note  Patient: Tracey [name]  Procedure(s) Performed: COLONOSCOPY WITH PROPOFOL (Rectum)  Patient Location: PACU  Anesthesia Type: General  Level of Consciousness: awake, alert  and patient cooperative  Airway and Oxygen Therapy: Patient Spontanous Breathing and Patient connected to supplemental oxygen  Post-op Assessment: Post-op Vital signs reviewed, Patient's Cardiovascular Status Stable, Respiratory Function Stable, Patent Airway and No signs of Nausea or vomiting  Post-op Vital Signs: Reviewed and stable  Complications: No notable events documented.

## 2022-07-26 NOTE — H&P (Signed)
Cephas Darby, MD [address]  [place]  [place], [place] [address]  Main: [phone]  Fax: [phone] Pager: [phone]  Primary Care Physician:  Burnard Hawthorne, FNP Primary Gastroenterologist:  Dr. Cephas Darby  Pre-Procedure History & Physical: HPI:  Tracey [name] is a 51 y.o. female is here for an colonoscopy.   Past Medical History:  Diagnosis Date   Allergy    Seasonal   Anxiety    Arthritis    Cancer ([place]) skin   basal cell neck   Complication of anesthesia    Pt was very aggressive and agitated after back surgery-developed ileus s after back surgery   Depression    Frequent headaches    migraines   Ileus ([place])    developed after back surgery   PONV (postoperative nausea and vomiting)     Past Surgical History:  Procedure Laterality Date   ARTHRODESIS METATARSALPHALANGEAL JOINT (MTPJ) Bilateral 05/19/2021   Procedure: RIGHT ARTHRODESIS METATARSALPHALANGEAL JOINT (MTPJ) , LEFT GREAT TOE INJECTION;  Surgeon: Samara Deist, DPM;  Location: [facility];  Service: Podiatry;  Laterality: Bilateral;   ARTHRODESIS METATARSALPHALANGEAL JOINT (MTPJ) Left 10/11/2021   Procedure: ARTHRODESIS METATARSALPHALANGEAL JOINT (MTPJ);  Surgeon: Samara Deist, DPM;  Location: [place];  Service: Podiatry;  Laterality: Left;   BACK SURGERY  12/31/2018   Dr Dimmig; rod and screw   ENDOMETRIAL ABLATION     FOOT SURGERY Left    TUBAL LIGATION  2013   WISDOM TOOTH EXTRACTION      Prior to Admission medications   Medication Sig Start Date End Date Taking? Authorizing Provider  buPROPion (WELLBUTRIN XL) 300 MG 24 hr tablet Take 1 tablet (300 mg total) by mouth every morning. 07/04/22  Yes Arnett, Yvetta Coder, FNP  busPIRone (BUSPAR) 10 MG tablet TAKE (1) TABLET BY MOUTH DAILY AT BEDTIME 01/25/22  Yes Arnett, Yvetta Coder, FNP  gabapentin (NEURONTIN) 300 MG capsule Take 1 capsule (300 mg total) by mouth at bedtime. 05/07/22  Yes Arnett, Yvetta Coder, FNP   montelukast (SINGULAIR) 10 MG tablet Take 1 tablet (10 mg total) by mouth at bedtime. 11/29/21  Yes Burnard Hawthorne, FNP  traZODone (DESYREL) 50 MG tablet TAKE (2) TABLETS BY MOUTH EVERY NIGHT ATBEDTIME 05/07/22  Yes Burnard Hawthorne, FNP  cetirizine (ZYRTEC) 10 MG tablet Take 10 mg by mouth daily as needed for allergies. Patient not taking: Reported on 07/04/2022    [provider]  Multiple Vitamins-Minerals (MULTIVITAMIN WITH MINERALS) tablet Take 1 tablet by mouth daily. Patient not taking: Reported on 07/19/2022    [provider]    Allergies as of 07/05/2022 - Review Complete 07/04/2022  Allergen Reaction Noted   Molds & smuts  12/19/2015    Family History  Problem Relation Age of Onset   Breast cancer Paternal Grandmother    Diabetes Paternal Grandmother    Other Mother        accidental death   Cancer Father        brain tumor   Melanoma Father    Lung cancer Father    Alcohol abuse Brother    Drug abuse Brother    Anxiety disorder Brother    Depression Brother     Social History   Socioeconomic History   Marital status: Married    Spouse name: Not on file   Number of children: Not on file   Years of education: Not on file   Highest education level: Not on file  Occupational  History   Not on file  Tobacco Use   Smoking status: Never   Smokeless tobacco: Never  Vaping Use   Vaping Use: Never used  Substance and Sexual Activity   Alcohol use: Not Currently    Alcohol/week: 1.0 standard drink of alcohol    Types: 1 Glasses of wine per week    Comment: wine rarely   Drug use: No   Sexual activity: Not Currently    Birth control/protection: Other-see comments  Other Topics Concern   Not on file  Social History Narrative   Caffeine- Tea rarely    Children- 2 boys      First grandchild born 05/2021, name is Event organiser   Social Determinants of Health   Financial Resource Strain: Not on file  Food Insecurity: Not on file  Transportation  Needs: Not on file  Physical Activity: Not on file  Stress: Not on file  Social Connections: Not on file  Intimate Partner Violence: Not on file    Review of Systems: See HPI, otherwise negative ROS  Physical Exam: BP 108/67   Pulse 69   Temp (!) 97.3 F (36.3 C) (Temporal)   Ht '5\' 2"'$  (1.575 m)   Wt 59.9 kg   LMP  (LMP Unknown)   SpO2 100%   BMI 24.14 kg/m  General:   Alert,  pleasant and cooperative in NAD Head:  Normocephalic and atraumatic. Neck:  Supple; no masses or thyromegaly. Lungs:  Clear throughout to auscultation.    Heart:  Regular rate and rhythm. Abdomen:  Soft, nontender and nondistended. Normal bowel sounds, without guarding, and without rebound.   Neurologic:  Alert and  oriented x4;  grossly normal neurologically.  Impression/Plan: Tracey [name] is here for an colonoscopy to be performed for colon cancer screening  Risks, benefits, limitations, and alternatives regarding  colonoscopy have been reviewed with the patient.  Questions have been answered.  All parties agreeable.   Sherri Sear, MD  07/26/2022, 8:12 AM

## 2022-07-26 NOTE — Anesthesia Preprocedure Evaluation (Signed)
Anesthesia Evaluation  Patient identified by MRN, date of birth, ID band Patient awake    Reviewed: Allergy & Precautions, NPO status , Patient's Chart, lab work & pertinent test results  History of Anesthesia Complications (+) PONV, Emergence Delirium and history of anesthetic complications  Airway Mallampati: III  TM Distance: >3 FB Neck ROM: full    Dental  (+) Chipped   Pulmonary neg pulmonary ROS, neg shortness of breath,    Pulmonary exam normal        Cardiovascular Exercise Tolerance: Good (-) anginanegative cardio ROS Normal cardiovascular exam     Neuro/Psych  Headaches, PSYCHIATRIC DISORDERS    GI/Hepatic negative GI ROS, Neg liver ROS, neg GERD  ,  Endo/Other  negative endocrine ROS  Renal/GU negative Renal ROS  negative genitourinary   Musculoskeletal   Abdominal   Peds  Hematology negative hematology ROS (+)   Anesthesia Other Findings Past Medical History: No date: Allergy     Comment:  Seasonal No date: Anxiety No date: Arthritis skin: Cancer ([place])     Comment:  basal cell neck No date: Complication of anesthesia     Comment:  Pt was very aggressive and agitated after back               surgery-developed ileus s after back surgery No date: Depression No date: Frequent headaches     Comment:  migraines No date: Ileus ([place])     Comment:  developed after back surgery No date: PONV (postoperative nausea and vomiting)  Past Surgical History: 05/19/2021: ARTHRODESIS METATARSALPHALANGEAL JOINT (MTPJ); Bilateral     Comment:  Procedure: RIGHT ARTHRODESIS METATARSALPHALANGEAL JOINT               (MTPJ) , LEFT GREAT TOE INJECTION;  Surgeon: Samara Deist, DPM;  Location: [facility];  Service: Podiatry;                Laterality: Bilateral; 10/11/2021: ARTHRODESIS METATARSALPHALANGEAL JOINT (MTPJ); Left     Comment:  Procedure: ARTHRODESIS METATARSALPHALANGEAL JOINT                (MTPJ);  Surgeon: Samara Deist, DPM;  Location: [facility];  Service: Podiatry;  Laterality: Left; 12/31/2018: BACK SURGERY     Comment:  Dr Sharyne Richters; rod and screw No date: ENDOMETRIAL ABLATION No date: FOOT SURGERY; Left 2013: TUBAL LIGATION No date: WISDOM TOOTH EXTRACTION  BMI    Body Mass Index: 24.14 kg/m      Reproductive/Obstetrics negative OB ROS                             Anesthesia Physical Anesthesia Plan  ASA: 3  Anesthesia Plan: General   Post-op Pain Management:    Induction: Intravenous  PONV Risk Score and Plan: Propofol infusion and TIVA  Airway Management Planned: Natural Airway and Nasal Cannula  Additional Equipment:   Intra-op Plan:   Post-operative Plan:   Informed Consent: I have reviewed the patients History and Physical, chart, labs and discussed the procedure including the risks, benefits and alternatives for the proposed anesthesia with the patient or authorized representative who has indicated his/her understanding and acceptance.     Dental Advisory Given  Plan Discussed with: Anesthesiologist, CRNA and Surgeon  Anesthesia Plan Comments: (Patient consented for risks  of anesthesia including but not limited to:  - adverse reactions to medications - risk of airway placement if required - damage to eyes, teeth, lips or other oral mucosa - nerve damage due to positioning  - sore throat or hoarseness - Damage to heart, brain, nerves, lungs, other parts of body or loss of life  Patient voiced understanding.)        Anesthesia Quick Evaluation

## 2022-07-26 NOTE — Anesthesia Postprocedure Evaluation (Signed)
Anesthesia Post Note  Patient: Tracey [name]  Procedure(s) Performed: COLONOSCOPY WITH PROPOFOL (Rectum)  Patient location during evaluation: Endoscopy Anesthesia Type: General Level of consciousness: awake and alert Pain management: pain level controlled Vital Signs Assessment: post-procedure vital signs reviewed and stable Respiratory status: spontaneous breathing, nonlabored ventilation, respiratory function stable and patient connected to nasal cannula oxygen Cardiovascular status: blood pressure returned to baseline and stable Postop Assessment: no apparent nausea or vomiting Anesthetic complications: no   No notable events documented.   Last Vitals:  Vitals:   07/26/22 0759 07/26/22 0919  BP: 108/67 (!) 96/58  Pulse: 69 81  Resp:  15  Temp: (!) 36.3 C (!) 36.4 C  SpO2: 100% 100%    Last Pain:  Vitals:   07/26/22 0929  TempSrc:   PainSc: 0-No pain                 Precious Haws Rilyn Upshaw

## 2022-07-26 NOTE — Op Note (Signed)
[facility] Gastroenterology Patient Name: Tracey [name] Procedure Date: 07/26/2022 8:45 AM MRN: [id number] Account #: [id number] Date of Birth: October 21, 1970 Admit Type: Outpatient Age: 51 Room: [facility] OR ROOM 01 Gender: Female Note Status: Finalized Instrument Name: [id number] Procedure:             Colonoscopy Indications:           Screening for colorectal malignant neoplasm, This is                         the patient's first colonoscopy Providers:             Lin Landsman MD, MD Referring MD:          Yvetta Coder. Arnett (Referring MD) Medicines:             General Anesthesia Complications:         No immediate complications. Estimated blood loss: None. Procedure:             Pre-Anesthesia Assessment:                        - Prior to the procedure, a History and Physical was                         performed, and patient medications and allergies were                         reviewed. The patient is competent. The risks and                         benefits of the procedure and the sedation options and                         risks were discussed with the patient. All questions                         were answered and informed consent was obtained.                         Patient identification and proposed procedure were                         verified by the physician, the nurse, the                         anesthesiologist, the anesthetist and the technician                         in the pre-procedure area in the procedure room in the                         endoscopy suite. Mental Status Examination: alert and                         oriented. Airway Examination: normal oropharyngeal                         airway and neck mobility. Respiratory Examination:  clear to auscultation. CV Examination: normal.                         Prophylactic Antibiotics: The patient does not require                         prophylactic  antibiotics. Prior Anticoagulants: The                         patient has taken no anticoagulant or antiplatelet                         agents. ASA Grade Assessment: III - A patient with                         severe systemic disease. After reviewing the risks and                         benefits, the patient was deemed in satisfactory                         condition to undergo the procedure. The anesthesia                         plan was to use general anesthesia. Immediately prior                         to administration of medications, the patient was                         re-assessed for adequacy to receive sedatives. The                         heart rate, respiratory rate, oxygen saturations,                         blood pressure, adequacy of pulmonary ventilation, and                         response to care were monitored throughout the                         procedure. The physical status of the patient was                         re-assessed after the procedure.                        After obtaining informed consent, the colonoscope was                         passed under direct vision. Throughout the procedure,                         the patient's blood pressure, pulse, and oxygen                         saturations were monitored continuously. The  Colonoscope was introduced through the anus and                         advanced to the the cecum, identified by appendiceal                         orifice and ileocecal valve. The colonoscopy was                         performed without difficulty. The patient tolerated                         the procedure well. The quality of the bowel                         preparation was evaluated using the BBPS [facility] Bowel                         Preparation Scale) with scores of: Right Colon = 3,                         Transverse Colon = 3 and Left Colon = 3 (entire mucosa                         seen  well with no residual staining, small fragments                         of stool or opaque liquid). The total BBPS score                         equals 9. The ileocecal valve, appendiceal orifice,                         and rectum were photographed. Findings:      The perianal and digital rectal examinations were normal. Pertinent       negatives include normal sphincter tone and no palpable rectal lesions.      The entire examined colon appeared normal.      The retroflexed view of the distal rectum and anal verge was normal and       showed no anal or rectal abnormalities. Impression:            - The entire examined colon is normal.                        - The distal rectum and anal verge are normal on                         retroflexion view.                        - No specimens collected. Recommendation:        - Discharge patient to home (with escort).                        - Resume previous diet today.                        -  Continue present medications.                        - Repeat colonoscopy in 10 years for screening                         purposes. Procedure Code(s):     --- Professional ---                        R7408, Colorectal cancer screening; colonoscopy on                         individual not meeting criteria for high risk Diagnosis Code(s):     --- Professional ---                        Z12.11, Encounter for screening for malignant neoplasm                         of colon CPT copyright 2022 American Medical Association. All rights reserved. The codes documented in this report are preliminary and upon coder review may  be revised to meet current compliance requirements. Dr. Ulyess Mort Lin Landsman MD, MD 07/26/2022 9:17:35 AM This report has been signed electronically. Number of Addenda: 0 Note Initiated On: 07/26/2022 8:45 AM Scope Withdrawal Time: 0 hours 6 minutes 25 seconds  Total Procedure Duration: 0 hours 11 minutes 17 seconds   Estimated Blood Loss:  Estimated blood loss: none.      [facility]

## 2022-07-27 ENCOUNTER — Encounter: Payer: Self-pay | Admitting: Gastroenterology

## 2022-08-16 DIAGNOSIS — M79644 Pain in right finger(s): Secondary | ICD-10-CM | POA: Diagnosis not present

## 2022-08-16 DIAGNOSIS — R2231 Localized swelling, mass and lump, right upper limb: Secondary | ICD-10-CM | POA: Diagnosis not present

## 2022-10-15 ENCOUNTER — Other Ambulatory Visit: Payer: Self-pay | Admitting: Family

## 2022-10-15 DIAGNOSIS — F411 Generalized anxiety disorder: Secondary | ICD-10-CM

## 2022-10-17 ENCOUNTER — Other Ambulatory Visit: Payer: Self-pay | Admitting: Family

## 2023-01-21 ENCOUNTER — Ambulatory Visit
Admission: EM | Admit: 2023-01-21 | Discharge: 2023-01-21 | Disposition: A | Discharge status: Home / Self Care | Payer: No Typology Code available for payment source | Attending: Emergency Medicine | Admitting: Emergency Medicine

## 2023-01-21 DIAGNOSIS — J302 Other seasonal allergic rhinitis: Secondary | ICD-10-CM

## 2023-01-21 DIAGNOSIS — R051 Acute cough: Secondary | ICD-10-CM

## 2023-01-21 MED ORDER — BENZONATATE 100 MG PO CAPS: 100.0000 mg | ORAL_CAPSULE | Freq: Three times a day (TID) | ORAL | 0 refills | Status: DC

## 2023-01-21 MED ORDER — PREDNISONE 20 MG PO TABS: 40.0000 mg | ORAL_TABLET | Freq: Every day | ORAL | 0 refills | Status: DC

## 2023-01-21 MED ORDER — PROMETHAZINE-DM 6.25-15 MG/5ML PO SYRP: 5.0000 mL | ORAL_SOLUTION | Freq: Every evening | ORAL | 0 refills | Status: DC | PRN

## 2023-01-21 NOTE — Discharge Instructions (Signed)
Symptoms today are most likely related to your allergies as airway is irritated  Low suspicion for pneumonia or bronchitis at this time  Begin prednisone every morning with food for 5 days, this medicine relaxes the airway which will make it easier for you to breathe, also helps with muscular pain and will calm your chest soreness  May take Tylenol 500 to 1000 mg every 6 hours in addition to this  You may use Tessalon pill every 8 hours as needed to help calm your coughing  You may use cough syrup at bedtime for additional comfort, be mindful of this medicine will make you drowsy and allow you to sleep  You may use a heating pad over the chest wall in 10 to 15-minute intervals for comfort  You may continue use of daily allergy medicine to further help minimize symptoms  At any point if you believe your breathing is worsening or does not improve please follow-up for reevaluation

## 2023-01-21 NOTE — ED Triage Notes (Signed)
Pt c/o congestion, allergies and cough that she usually deals with every year. Pt state she takes  a daily zyrtec, chest congestion, itching in chest. Pt states cough worse at night

## 2023-01-21 NOTE — ED Provider Notes (Signed)
[facility]    CSN: [id number] Arrival date & time: 01/21/23  4098      History   Chief Complaint Chief Complaint  Patient presents with   Chest Congestion   Cough   Shortness of Breath         HPI Tracey [name] is a 52 y.o. female.   Patient presents for evaluation of a worsening nonproductive cough over the past 3 days.  Has been experiencing nasal congestion and cough which she relates to her allergies over the past 3 weeks, managing with Zyrtec.  Over the last 3 days has been going to experience chest soreness, pain with deep breathing and a constant irritation and burning sensation.  Cough also has been causing headaches and interfering with sleep.  Has begun to experience shortness of breath with exertion but denies wheezing.  History of seasonal allergies, endorses worsened this year.  Denies fever, chills, body aches, ear pain sore throat .    Past Medical History:  Diagnosis Date   Allergy    Seasonal   Anxiety    Arthritis    Cancer skin   basal cell neck   Complication of anesthesia    Pt was very aggressive and agitated after back surgery-developed ileus s after back surgery   Depression    Frequent headaches    migraines   Ileus    developed after back surgery   PONV (postoperative nausea and vomiting)     Patient Active Problem List   Diagnosis Date Noted   Encounter for screening colonoscopy    Low back pain 12/15/2019   Toe pain, bilateral 01/14/2019   GAD (generalized anxiety disorder) 06/25/2018   Allergic rhinitis 06/24/2017   Concentration deficit 02/14/2017   Encounter to establish care 08/28/2015   Frequent headaches 08/28/2015   History of endometrial ablation 08/28/2015   Insomnia 08/28/2015    Past Surgical History:  Procedure Laterality Date   ARTHRODESIS METATARSALPHALANGEAL JOINT (MTPJ) Bilateral 05/19/2021   Procedure: RIGHT ARTHRODESIS METATARSALPHALANGEAL JOINT (MTPJ) , LEFT GREAT TOE INJECTION;  Surgeon:  Gwyneth Revels, DPM;  Location: [facility];  Service: Podiatry;  Laterality: Bilateral;   ARTHRODESIS METATARSALPHALANGEAL JOINT (MTPJ) Left 10/11/2021   Procedure: ARTHRODESIS METATARSALPHALANGEAL JOINT (MTPJ);  Surgeon: Gwyneth Revels, DPM;  Location: [facility];  Service: Podiatry;  Laterality: Left;   BACK SURGERY  12/31/2018   Dr Iline Oven; rod and screw   COLONOSCOPY WITH PROPOFOL N/A 07/26/2022   Procedure: COLONOSCOPY WITH PROPOFOL;  Surgeon: Toney Reil, MD;  Location: [facility];  Service: Endoscopy;  Laterality: N/A;   ENDOMETRIAL ABLATION     FOOT SURGERY Left    TUBAL LIGATION  2013   WISDOM TOOTH EXTRACTION      OB History   No obstetric history on file.      Home Medications    Prior to Admission medications   Medication Sig Start Date End Date Taking? Authorizing Provider  buPROPion (WELLBUTRIN XL) 300 MG 24 hr tablet Take 1 tablet (300 mg total) by mouth every morning. 07/04/22   Allegra Grana, FNP  busPIRone (BUSPAR) 10 MG tablet TAKE (1) TABLET BY MOUTH DAILY AT BEDTIME 10/17/22   Allegra Grana, FNP  gabapentin (NEURONTIN) 300 MG capsule Take 1 capsule (300 mg total) by mouth at bedtime. 05/07/22   Allegra Grana, FNP  levocetirizine (XYZAL) 5 MG tablet TAKE (1) TABLET BY MOUTH EVERY DAY 10/17/22   Allegra Grana, FNP  montelukast (SINGULAIR) 10 MG  tablet Take 1 tablet (10 mg total) by mouth at bedtime. 11/29/21   Allegra Grana, FNP  traZODone (DESYREL) 50 MG tablet TAKE (2) TABLETS BY MOUTH EVERY NIGHT ATBEDTIME 05/07/22   Allegra Grana, FNP    Family History Family History  Problem Relation Age of Onset   Breast cancer Paternal Grandmother    Diabetes Paternal Grandmother    Other Mother        accidental death   Cancer Father        brain tumor   Melanoma Father    Lung cancer Father    Alcohol abuse Brother    Drug abuse Brother    Anxiety disorder Brother    Depression Brother     Social History Social  History   Tobacco Use   Smoking status: Never   Smokeless tobacco: Never  Vaping Use   Vaping Use: Never used  Substance Use Topics   Alcohol use: Not Currently    Alcohol/week: 1.0 standard drink of alcohol    Types: 1 Glasses of wine per week    Comment: wine rarely   Drug use: No     Allergies   Molds & smuts and Montelukast   Review of Systems Review of Systems  Constitutional: Negative.   HENT: Negative.    Respiratory:  Positive for cough and shortness of breath. Negative for apnea, choking, chest tightness, wheezing and stridor.   Cardiovascular: Negative.   Gastrointestinal: Negative.      Physical Exam Triage Vital Signs ED Triage Vitals  Enc Vitals Group     BP 01/21/23 1008 124/82     Pulse Rate 01/21/23 1008 78     Resp --      Temp 01/21/23 1008 97.7 F (36.5 C)     Temp Source 01/21/23 1008 Oral     SpO2 01/21/23 1008 98 %     Weight 01/21/23 1007 127 lb (57.6 kg)     Height 01/21/23 1007  (1.575 m)     Head Circumference --      Peak Flow --      Pain Score 01/21/23 1007 4     Pain Loc --      Pain Edu? --      Excl. in GC? --    No data found.  Updated Vital Signs BP 124/82 (BP Location: Right Arm)   Pulse 78   Temp 97.7 F (36.5 C) (Oral)   Ht  (1.575 m)   Wt 127 lb (57.6 kg)   SpO2 98%   BMI 23.23 kg/m   Visual Acuity Right Eye Distance:   Left Eye Distance:   Bilateral Distance:    Right Eye Near:   Left Eye Near:    Bilateral Near:     Physical Exam Constitutional:      Appearance: Normal appearance.  HENT:     Right Ear: Tympanic membrane, ear canal and external ear normal.     Left Ear: Tympanic membrane, ear canal and external ear normal.     Nose: Congestion and rhinorrhea present.     Mouth/Throat:     Mouth: Mucous membranes are moist.     Pharynx: Oropharynx is clear.  Eyes:     Extraocular Movements: Extraocular movements intact.  Cardiovascular:     Rate and Rhythm: Normal rate and regular  rhythm.     Pulses: Normal pulses.     Heart sounds: Normal heart sounds.  Pulmonary:  Effort: Pulmonary effort is normal.     Breath sounds: Normal breath sounds.  Skin:    General: Skin is warm and dry.  Neurological:     Mental Status: She is alert and oriented to person, place, and time. Mental status is at baseline.      UC Treatments / Results  Labs (all labs ordered are listed, but only abnormal results are displayed) Labs Reviewed - No data to display  EKG   Radiology No results found.  Procedures Procedures (including critical care time)  Medications Ordered in UC Medications - No data to display  Initial Impression / Assessment and Plan / UC Course  I have reviewed the triage vital signs and the nursing notes.  Pertinent labs & imaging results that were available during my care of the patient were reviewed by me and considered in my medical decision making (see chart for details).  Acute cough, seasonal allergies  Patient is in no signs of distress nor toxic appearing.  Vital signs are stable.  Low suspicion for pneumonia, pneumothorax or bronchitis and therefore will defer imaging.  Will manage symptoms.  Prescribed prednisone, Tessalon and Promethazine DM.  Advised continued use of antihistamine. May use additional over-the-counter medications as needed for supportive care.  May follow-up with urgent care as needed if symptoms persist or worsen.  Final Clinical Impressions(s) / UC Diagnoses   Final diagnoses:  None   Discharge Instructions   None    ED Prescriptions   None    PDMP not reviewed this encounter.   Valinda Hoar, NP 01/21/23 1049

## 2023-02-21 ENCOUNTER — Other Ambulatory Visit: Payer: Self-pay | Admitting: Family

## 2023-02-21 DIAGNOSIS — F411 Generalized anxiety disorder: Secondary | ICD-10-CM

## 2023-05-08 ENCOUNTER — Other Ambulatory Visit: Payer: Self-pay | Admitting: Family

## 2023-05-08 DIAGNOSIS — F411 Generalized anxiety disorder: Secondary | ICD-10-CM

## 2023-06-12 ENCOUNTER — Other Ambulatory Visit: Payer: Self-pay | Admitting: Obstetrics and Gynecology

## 2023-06-12 DIAGNOSIS — Z1231 Encounter for screening mammogram for malignant neoplasm of breast: Secondary | ICD-10-CM

## 2023-07-17 ENCOUNTER — Other Ambulatory Visit: Payer: Self-pay | Admitting: Family

## 2023-07-17 DIAGNOSIS — M545 Low back pain, unspecified: Secondary | ICD-10-CM

## 2023-07-22 NOTE — Telephone Encounter (Signed)
Pt needs a f/u appt per Jason Coop

## 2023-08-16 ENCOUNTER — Ambulatory Visit
Admission: RE | Admit: 2023-08-16 | Discharge: 2023-08-16 | Disposition: A | Discharge status: Home / Self Care | Payer: No Typology Code available for payment source | Source: Ambulatory Visit | Attending: Obstetrics and Gynecology | Admitting: Obstetrics and Gynecology

## 2023-08-16 DIAGNOSIS — Z1231 Encounter for screening mammogram for malignant neoplasm of breast: Secondary | ICD-10-CM | POA: Diagnosis present

## 2023-08-22 ENCOUNTER — Ambulatory Visit: Payer: No Typology Code available for payment source | Admitting: Family

## 2023-08-22 ENCOUNTER — Encounter: Payer: Self-pay | Admitting: Family

## 2023-08-22 VITALS — BP 116/74 | HR 85 | Temp 97.9°F | Ht 62.0 in | Wt 124.6 lb

## 2023-08-22 DIAGNOSIS — G4700 Insomnia, unspecified: Secondary | ICD-10-CM | POA: Diagnosis not present

## 2023-08-22 DIAGNOSIS — F411 Generalized anxiety disorder: Secondary | ICD-10-CM | POA: Diagnosis not present

## 2023-08-22 MED ORDER — ESZOPICLONE 1 MG PO TABS: 1.0000 mg | ORAL_TABLET | Freq: Every evening | ORAL | 1 refills | Status: DC | PRN

## 2023-08-22 NOTE — Assessment & Plan Note (Signed)
Suboptimal control and we jointly agreed that trazodone could be causing a groggy effect the next day.  We discussed trial of Lunesta.  Patient previous had a fall while taking Ambien; we deferred a retrial at this time.  Discussed sedentary lifestyle and encouraged her to start daily exercise.

## 2023-08-22 NOTE — Assessment & Plan Note (Signed)
Chronic, stable.  Continue  wellbutrin 300mg  qam, buspar 10mg  every day prn.

## 2023-08-22 NOTE — Patient Instructions (Signed)
Please wean off trazodone 100 mg by starting trazodone 50 mg each night for a couple of nights, then stop medication completely.  At that point you may then try Lunesta 1 mg.  Do not drive or operate heavy machinery while on Lunesta Please do not drink alcohol.  This medication make you feel drowsy so be very careful.  Stop taking if become too drowsy or somnolent as this puts you at risk for falls.    Please contact our office with any questions.

## 2023-08-22 NOTE — Progress Notes (Signed)
Assessment & Plan:  Insomnia, unspecified type Assessment & Plan: Suboptimal control and we jointly agreed that trazodone could be causing a groggy effect the next day.  We discussed trial of Lunesta.  Patient previous had a fall while taking Ambien; we deferred a retrial at this time.  Discussed sedentary lifestyle and encouraged her to start daily exercise.   Orders: -     Eszopiclone; Take 1 tablet (1 mg total) by mouth at bedtime as needed for sleep. Take immediately before bedtime  Dispense: 30 tablet; Refill: 1  GAD (generalized anxiety disorder) Assessment & Plan: Chronic, stable.  Continue  wellbutrin 300mg  qam, buspar 10mg  every day prn.       Return precautions given.   Risks, benefits, and alternatives of the medications and treatment plan prescribed today were discussed, and patient expressed understanding.   Education regarding symptom management and diagnosis given to patient on AVS either electronically or printed.  Return in about 3 months (around 11/22/2023).  Rennie Plowman, FNP  Subjective:    Patient ID: Tracey [name], female    DOB: [birth year], 52 y.o.   MRN: [id number]  CC: Tracey [name] is a 52 y.o. female who presents today for follow up.   HPI: Complains of fatigue.  She is no longer working out regularly as she used to.  She works from home is sitting all day.   Endorses trouble motivating to exercise.  She is concerned trazodone is making her feel groggy.  Sleep is not restorative.  She is overall sleeping well on trazodone 100 mg.  Previously been on Ambien however suffered a fall and medication was discontinued   Continue  trazodone 100mg , wellbutrin 300mg  qam, buspar 10mg  prn.   Pap 08/2020 negative malignancy Labs obtained by Tamsen Snider, 08/16/2023 with normal B12, vitamin D, TSH, hemoglobin.   Allergies: Molds & smuts and Montelukast Current Outpatient Medications on File Prior to Visit  Medication Sig Dispense Refill    buPROPion (WELLBUTRIN XL) 300 MG 24 hr tablet TAKE (1) TABLET BY MOUTH EVERY MORNING 90 tablet 1   busPIRone (BUSPAR) 10 MG tablet TAKE (1) TABLET BY MOUTH DAILY AT BEDTIME 90 tablet 3   gabapentin (NEURONTIN) 300 MG capsule Take 1 capsule (300 mg total) by mouth at bedtime. 30 capsule 1   levocetirizine (XYZAL) 5 MG tablet TAKE (1) TABLET BY MOUTH EVERY DAY 90 tablet 3   No current facility-administered medications on file prior to visit.    Review of Systems  Constitutional:  Positive for fatigue. Negative for chills and fever.  Respiratory:  Negative for cough.   Cardiovascular:  Negative for chest pain and palpitations.  Gastrointestinal:  Negative for nausea and vomiting.      Objective:    BP 116/74   Pulse 85   Temp 97.9 F (36.6 C) (Oral)   Ht 5\' 2"  (1.575 m)   Wt 124 lb 9.6 oz (56.5 kg)   SpO2 97%   BMI 22.79 kg/m  BP Readings from Last 3 Encounters:  08/22/23 116/74  01/21/23 124/82  07/26/22 (!) 96/58   Wt Readings from Last 3 Encounters:  08/22/23 124 lb 9.6 oz (56.5 kg)  01/21/23 127 lb (57.6 kg)  07/26/22 132 lb (59.9 kg)    Physical Exam Vitals reviewed.  Constitutional:      Appearance: She is well-developed.  Eyes:     Conjunctiva/sclera: Conjunctivae normal.  Neck:     Thyroid: No thyroid mass or thyromegaly.  Cardiovascular:  Rate and Rhythm: Normal rate and regular rhythm.     Pulses: Normal pulses.     Heart sounds: Normal heart sounds.  Pulmonary:     Effort: Pulmonary effort is normal.     Breath sounds: Normal breath sounds. No wheezing, rhonchi or rales.  Lymphadenopathy:     Head:     Right side of head: No submental, submandibular, tonsillar, preauricular, posterior auricular or occipital adenopathy.     Left side of head: No submental, submandibular, tonsillar, preauricular, posterior auricular or occipital adenopathy.     Cervical: No cervical adenopathy.  Skin:    General: Skin is warm and dry.  Neurological:     Mental  Status: She is alert.  Psychiatric:        Speech: Speech normal.        Behavior: Behavior normal.        Thought Content: Thought content normal.

## 2023-09-24 ENCOUNTER — Other Ambulatory Visit: Payer: Self-pay | Admitting: Family

## 2023-09-24 DIAGNOSIS — F411 Generalized anxiety disorder: Secondary | ICD-10-CM

## 2023-10-25 ENCOUNTER — Other Ambulatory Visit: Payer: Self-pay | Admitting: Family

## 2023-10-25 DIAGNOSIS — M545 Low back pain, unspecified: Secondary | ICD-10-CM

## 2023-10-25 DIAGNOSIS — G4700 Insomnia, unspecified: Secondary | ICD-10-CM

## 2023-10-28 NOTE — Telephone Encounter (Signed)
Spoke to pt and scheduled her for 11/14/23 for f/up for controlled substances

## 2023-10-30 ENCOUNTER — Other Ambulatory Visit (HOSPITAL_COMMUNITY): Payer: Self-pay

## 2023-10-30 ENCOUNTER — Telehealth: Payer: Self-pay

## 2023-10-30 NOTE — Telephone Encounter (Signed)
*  Primary  Pharmacy Patient Advocate Encounter  Received notification from [facility] that Prior Authorization for Eszopiclone 1MG  tablets  has been APPROVED from 10/30/2023 to 04/27/2024. Unable to obtain price due to refill too soon rejection, last fill date 10/28/2023 next available fill date02/12/2023   PA #/Case ID/Reference #: [id number]

## 2023-11-14 ENCOUNTER — Ambulatory Visit: Payer: No Typology Code available for payment source | Admitting: Family

## 2023-11-14 VITALS — BP 120/76 | HR 78 | Temp 98.2°F | Ht 62.0 in | Wt 129.0 lb

## 2023-11-14 DIAGNOSIS — F411 Generalized anxiety disorder: Secondary | ICD-10-CM

## 2023-11-14 DIAGNOSIS — G4700 Insomnia, unspecified: Secondary | ICD-10-CM | POA: Diagnosis not present

## 2023-11-14 DIAGNOSIS — M545 Low back pain, unspecified: Secondary | ICD-10-CM | POA: Diagnosis not present

## 2023-11-14 DIAGNOSIS — G8929 Other chronic pain: Secondary | ICD-10-CM

## 2023-11-14 DIAGNOSIS — J309 Allergic rhinitis, unspecified: Secondary | ICD-10-CM | POA: Diagnosis not present

## 2023-11-14 MED ORDER — ESZOPICLONE 2 MG PO TABS: 2.0000 mg | ORAL_TABLET | Freq: Every day | ORAL | 2 refills | Status: DC

## 2023-11-14 MED ORDER — GABAPENTIN 600 MG PO TABS: 600.0000 mg | ORAL_TABLET | Freq: Every day | ORAL | 3 refills | Status: AC

## 2023-11-14 MED ORDER — BUSPIRONE HCL 10 MG PO TABS: ORAL_TABLET | ORAL | 1 refills | Status: DC

## 2023-11-14 MED ORDER — BUPROPION HCL ER (XL) 300 MG PO TB24: 300.0000 mg | ORAL_TABLET | Freq: Every day | ORAL | 3 refills | Status: AC

## 2023-11-14 MED ORDER — LEVOCETIRIZINE DIHYDROCHLORIDE 5 MG PO TABS: ORAL_TABLET | ORAL | 3 refills | Status: DC

## 2023-11-14 NOTE — Assessment & Plan Note (Signed)
Improved however remains suboptimal.  Increase Lunesta to 2 mg nightly.

## 2023-11-14 NOTE — Patient Instructions (Signed)
Trial in case increase gabapentin.    Trial increase Lunesta.    Please not increase both at the same time due to sedating properties   Please let me know how you are doing

## 2023-11-14 NOTE — Assessment & Plan Note (Signed)
Chronic chronic, suboptimal control.  Aggravated with recent exercise.  Patient politely declines updating imaging of back or hips at this time.  She prefers to increase gabapentin.  Increase gabapentin to 900 mg qpm, and titrate as needed.

## 2023-11-14 NOTE — Progress Notes (Signed)
Assessment & Plan:  Chronic midline low back pain without sciatica Assessment & Plan: Chronic chronic, suboptimal control.  Aggravated with recent exercise.  Patient politely declines updating imaging of back or hips at this time.  She prefers to increase gabapentin.  Increase gabapentin to 900 mg qpm, and titrate as needed.   Orders: -     Gabapentin; Take 1 tablet (600 mg total) by mouth at bedtime. Take with gabapentin 300mg   Dispense: 90 tablet; Refill: 3  GAD (generalized anxiety disorder) -     buPROPion HCl ER (XL); Take 1 tablet (300 mg total) by mouth daily.  Dispense: 90 tablet; Refill: 3 -     busPIRone HCl; TAKE (1) TABLET BY MOUTH DAILY AT BEDTIME  Dispense: 30 tablet; Refill: 1  Insomnia, unspecified type Assessment & Plan: Improved however remains suboptimal.  Increase Lunesta to 2 mg nightly.  Orders: -     Eszopiclone; Take 1 tablet (2 mg total) by mouth at bedtime. Take immediately before bedtime  Dispense: 30 tablet; Refill: 2  Allergic rhinitis, unspecified seasonality, unspecified trigger -     Levocetirizine Dihydrochloride; TAKE (1) TABLET BY MOUTH EVERY DAY  Dispense: 90 tablet; Refill: 3     Return precautions given.   Risks, benefits, and alternatives of the medications and treatment plan prescribed today were discussed, and patient expressed understanding.   Education regarding symptom management and diagnosis given to patient on AVS either electronically or printed.  Return in about 4 months (around 03/13/2024).  Rennie Plowman, FNP  Subjective:    Patient ID: Tracey [name], female    DOB: 11/24/1970, 53 y.o.   MRN: [id number]  CC: Tracey [name] is a 53 y.o. female who presents today for follow up.   HPI: Overall feels well today.  No new complaints.  Fatigue is greatly improved since cessation of trazodone. She has been working out daily every morning; low back pain has increased. Describes as an ache, stiffness which is similar  to prior pain.    She previously followed with [facility] ; no recent images.  Weather changes aggravate back pain as well  H/o lumbar back surgery   No numbness, saddle anesthesia, urinary or fecal incontinence.  She self increased gabapentin 900mg  qpm with relief  Lunesta 1mg  helpful however she would be interested in increasing d/t trouble staying asleep.    Allergies: Molds & smuts and Montelukast Current Outpatient Medications on File Prior to Visit  Medication Sig Dispense Refill   gabapentin (NEURONTIN) 300 MG capsule TAKE ONE (1) CAPSULE (300 MG TOTAL) BY MOUTH AT BEDTIME. 90 capsule 3   No current facility-administered medications on file prior to visit.    Review of Systems  Constitutional:  Negative for chills and fever.  Respiratory:  Negative for cough.   Cardiovascular:  Negative for chest pain and palpitations.  Gastrointestinal:  Negative for nausea and vomiting.  Musculoskeletal:  Positive for back pain.  Psychiatric/Behavioral:  Positive for sleep disturbance.       Objective:    BP 120/76   Pulse 78   Temp 98.2 F (36.8 C) (Oral)   Ht 5\' 2"  (1.575 m)   Wt 129 lb (58.5 kg)   SpO2 99%   BMI 23.59 kg/m  BP Readings from Last 3 Encounters:  11/14/23 120/76  08/22/23 116/74  01/21/23 124/82   Wt Readings from Last 3 Encounters:  11/14/23 129 lb (58.5 kg)  08/22/23 124 lb 9.6 oz (56.5 kg)  01/21/23 127 lb (57.6  kg)    Physical Exam Vitals reviewed.  Constitutional:      Appearance: She is well-developed.  Eyes:     Conjunctiva/sclera: Conjunctivae normal.  Cardiovascular:     Rate and Rhythm: Normal rate and regular rhythm.     Pulses: Normal pulses.     Heart sounds: Normal heart sounds.  Pulmonary:     Effort: Pulmonary effort is normal.     Breath sounds: Normal breath sounds. No wheezing, rhonchi or rales.  Skin:    General: Skin is warm and dry.  Neurological:     Mental Status: She is alert.  Psychiatric:        Speech:  Speech normal.        Behavior: Behavior normal.        Thought Content: Thought content normal.

## 2023-12-31 ENCOUNTER — Telehealth: Payer: Self-pay

## 2023-12-31 NOTE — Telephone Encounter (Signed)
 Copied from CRM [id number]. Topic: Clinical - Lab/Test Results >> Dec 31, 2023 10:28 AM Tracey [name] wrote: Reason for CRM: patient is calling in wanting to see if dr arnett can go over her lab results from [facility] she would like a call back regarding this she stated that her lab results are confusing to her

## 2024-02-21 ENCOUNTER — Other Ambulatory Visit: Payer: Self-pay | Admitting: Family

## 2024-02-21 DIAGNOSIS — G4700 Insomnia, unspecified: Secondary | ICD-10-CM

## 2024-03-12 ENCOUNTER — Ambulatory Visit: Payer: No Typology Code available for payment source | Admitting: Family

## 2024-03-12 ENCOUNTER — Encounter: Payer: Self-pay | Admitting: Family

## 2024-03-12 VITALS — BP 120/72 | HR 88 | Temp 98.2°F | Ht 62.0 in | Wt 129.0 lb

## 2024-03-12 DIAGNOSIS — G4700 Insomnia, unspecified: Secondary | ICD-10-CM

## 2024-03-12 DIAGNOSIS — M545 Low back pain, unspecified: Secondary | ICD-10-CM | POA: Diagnosis not present

## 2024-03-12 DIAGNOSIS — G8929 Other chronic pain: Secondary | ICD-10-CM

## 2024-03-12 NOTE — Progress Notes (Signed)
 Assessment & Plan:  Insomnia, unspecified type Assessment & Plan: Chronic, stable.  Continue Lunesta  2 mg nightly   Chronic midline low back pain without sciatica Assessment & Plan: Chronic, stable.  Continue gabapentin  900 mg nightly      Return precautions given.   Risks, benefits, and alternatives of the medications and treatment plan prescribed today were discussed, and patient expressed understanding.   Education regarding symptom management and diagnosis given to patient on AVS either electronically or printed.  Return in about 3 months (around 06/12/2024).  Bascom Bossier, FNP  Subjective:    Patient ID: Tracey [name], female    DOB: 1971/06/07, 53 y.o.   MRN: [id number]  CC: Tracey [name] is a 53 y.o. female who presents today for follow up.   HPI: Medication follow-up  feels well today.  No new complaints.   She increased gabapentin  to 900 mg qpm at prior visit She also increased Lunesta  to 2 mg at bedtime.  She is sleeping well on this regimen. No excessive somnolence or grogginess in the morning on medication regimen. Allergies: Molds & smuts and Montelukast  Current Outpatient Medications on File Prior to Visit  Medication Sig Dispense Refill   buPROPion  (WELLBUTRIN  XL) 300 MG 24 hr tablet Take 1 tablet (300 mg total) by mouth daily. 90 tablet 3   busPIRone  (BUSPAR ) 10 MG tablet TAKE (1) TABLET BY MOUTH DAILY AT BEDTIME 30 tablet 1   eszopiclone  (LUNESTA ) 2 MG TABS tablet TAKE ONE (1) TABLET (2 MG TOTAL) BY MOUTH AT BEDTIME. TAKE IMMEDIATELY BEFORE BEDTIME 30 tablet 2   gabapentin  (NEURONTIN ) 300 MG capsule TAKE ONE (1) CAPSULE (300 MG TOTAL) BY MOUTH AT BEDTIME. 90 capsule 3   gabapentin  (NEURONTIN ) 600 MG tablet Take 1 tablet (600 mg total) by mouth at bedtime. Take with gabapentin  300mg  90 tablet 3   levocetirizine (XYZAL ) 5 MG tablet TAKE (1) TABLET BY MOUTH EVERY DAY 90 tablet 3   No current facility-administered medications on file prior  to visit.    Review of Systems  Constitutional:  Negative for chills and fever.  Respiratory:  Negative for cough.   Cardiovascular:  Negative for chest pain and palpitations.  Gastrointestinal:  Negative for nausea and vomiting.  Psychiatric/Behavioral:  Negative for sleep disturbance.       Objective:    BP 120/72   Pulse 88   Temp 98.2 F (36.8 C) (Oral)   Ht 5' 2 (1.575 m)   Wt 129 lb (58.5 kg)   LMP  (LMP Unknown)   SpO2 98%   BMI 23.59 kg/m  BP Readings from Last 3 Encounters:  03/12/24 120/72  11/14/23 120/76  08/22/23 116/74   Wt Readings from Last 3 Encounters:  03/12/24 129 lb (58.5 kg)  11/14/23 129 lb (58.5 kg)  08/22/23 124 lb 9.6 oz (56.5 kg)    Physical Exam Vitals reviewed.  Constitutional:      Appearance: She is well-developed.   Eyes:     Conjunctiva/sclera: Conjunctivae normal.    Cardiovascular:     Rate and Rhythm: Normal rate and regular rhythm.     Pulses: Normal pulses.     Heart sounds: Normal heart sounds.  Pulmonary:     Effort: Pulmonary effort is normal.     Breath sounds: Normal breath sounds. No wheezing, rhonchi or rales.   Skin:    General: Skin is warm and dry.   Neurological:     Mental Status: She is alert.   Psychiatric:  Speech: Speech normal.        Behavior: Behavior normal.        Thought Content: Thought content normal.

## 2024-03-17 NOTE — Assessment & Plan Note (Signed)
 Chronic, stable.  Continue gabapentin  900 mg nightly

## 2024-03-17 NOTE — Assessment & Plan Note (Signed)
 Chronic, stable.  Continue Lunesta  2 mg nightly

## 2024-03-17 NOTE — Patient Instructions (Signed)
Nice to see you!   

## 2024-05-14 ENCOUNTER — Other Ambulatory Visit: Payer: Self-pay | Admitting: Family

## 2024-05-14 DIAGNOSIS — G4700 Insomnia, unspecified: Secondary | ICD-10-CM

## 2024-05-14 NOTE — Telephone Encounter (Signed)
 FYI Only or Action Required?: Action required by provider: medication refill request.  Patient was last seen in primary care on 03/12/2024 by Dineen Rollene MATSU, FNP.  Called Nurse Triage reporting No chief complaint on file..  Symptoms began today.  Interventions attempted: Nothing.  Symptoms are: stable.  Triage Disposition: No disposition on file.  Patient/caregiver understands and will follow disposition?:

## 2024-05-14 NOTE — Telephone Encounter (Unsigned)
 Copied from CRM [id number]. Topic: Clinical - Medication Refill >> May 14, 2024 12:57 PM Dedra B wrote: Medication: eszopiclone  (LUNESTA ) 2 MG TABS tablet  Has the patient contacted their pharmacy? No (Agent: If no, request that the patient contact the pharmacy for the refill. If patient does not wish to contact the pharmacy document the reason why and proceed with request.) Pt said that she keeps her medicine in the medicine cabinet over the toilet. The bottle fell and half fell in the toilet and the other half fell on the floor. She was able to catch a few but has taken the last one. She said that she only needs enough to last until her next refill.  This is the patient's preferred pharmacy:  [facility] - [place], [place] - [address] [place] [address] Phone: [phone] Fax: [phone]  Is this the correct pharmacy for this prescription? Yes If no, delete pharmacy and type the correct one.   Has the prescription been filled recently? Yes  Is the patient out of the medication? Yes  Has the patient been seen for an appointment in the last year OR does the patient have an upcoming appointment? Yes  Can we respond through MyChart? Yes  Agent: Please be advised that Rx refills may take up to 3 business days. We ask that you follow-up with your pharmacy.

## 2024-05-15 MED ORDER — ESZOPICLONE 2 MG PO TABS: 2.0000 mg | ORAL_TABLET | Freq: Every day | ORAL | 2 refills | Status: DC

## 2024-06-18 ENCOUNTER — Ambulatory Visit: Admitting: Family

## 2024-06-18 ENCOUNTER — Telehealth: Admitting: Family

## 2024-06-18 VITALS — Ht 62.0 in | Wt 124.0 lb

## 2024-06-18 DIAGNOSIS — G4700 Insomnia, unspecified: Secondary | ICD-10-CM | POA: Diagnosis not present

## 2024-06-18 DIAGNOSIS — M545 Low back pain, unspecified: Secondary | ICD-10-CM

## 2024-06-18 DIAGNOSIS — J309 Allergic rhinitis, unspecified: Secondary | ICD-10-CM

## 2024-06-18 DIAGNOSIS — G8929 Other chronic pain: Secondary | ICD-10-CM | POA: Diagnosis not present

## 2024-06-18 DIAGNOSIS — F411 Generalized anxiety disorder: Secondary | ICD-10-CM

## 2024-06-18 MED ORDER — BUSPIRONE HCL 10 MG PO TABS: 10.0000 mg | ORAL_TABLET | Freq: Every day | ORAL | 1 refills | Status: DC | PRN

## 2024-06-18 MED ORDER — ESZOPICLONE 2 MG PO TABS: 2.0000 mg | ORAL_TABLET | Freq: Every day | ORAL | 2 refills | Status: AC

## 2024-06-18 MED ORDER — LEVOCETIRIZINE DIHYDROCHLORIDE 5 MG PO TABS: ORAL_TABLET | ORAL | 3 refills | Status: AC

## 2024-06-18 NOTE — Progress Notes (Signed)
 Virtual Visit via Video Note  I connected with Tracey [name] on 06/21/24 at  4:00 PM EDT by a video enabled telemedicine application and verified that I am speaking with the correct person using two identifiers. Location patient: home Location provider: work  Persons participating in the virtual visit: patient, provider  I discussed the limitations of evaluation and management by telemedicine and the availability of in person appointments. The patient expressed understanding and agreed to proceed.  HPI: Medication follow-up Feels well today.  No new complaints.  She is expecting another grandchild. She is doing well on current regimen.  Compliant Lunesta  2 mg nightly for aids in sleep.  She remains compliant with gabapentin  at 900 mg nightly for chronic low back pain.  She is using BuSpar  as needed.  Denies excessive sedation on regimen. Mammogram up-to-date ROS: See pertinent positives and negatives per HPI.  EXAM:  VITALS per patient if applicable: Ht 5' 2 (1.575 m)   Wt 124 lb (56.2 kg)   BMI 22.68 kg/m  BP Readings from Last 3 Encounters:  03/12/24 120/72  11/14/23 120/76  08/22/23 116/74   Wt Readings from Last 3 Encounters:  06/18/24 124 lb (56.2 kg)  03/12/24 129 lb (58.5 kg)  11/14/23 129 lb (58.5 kg)      06/18/2024    3:54 PM 03/12/2024    4:11 PM 11/14/2023    2:35 PM  Depression screen PHQ 2/9  Decreased Interest 0 0 1  Down, Depressed, Hopeless 0 0 0  PHQ - 2 Score 0 0 1  Altered sleeping 0  1  Tired, decreased energy 0  1  Change in appetite 0  0  Feeling bad or failure about yourself  0  0  Trouble concentrating 0  1  Moving slowly or fidgety/restless 0  0  Suicidal thoughts 0  0  PHQ-9 Score 0  4  Difficult doing work/chores Not difficult at all        06/18/2024    3:54 PM 03/12/2024    4:11 PM 11/14/2023    2:36 PM 08/22/2023    2:57 PM  GAD 7 : Generalized Anxiety Score  Nervous, Anxious, on Edge 0 0 1 1  Control/stop worrying 0 0 1 1   Worry too much - different things 0 0 1 1  Trouble relaxing 0 0 1 1  Restless 0 0 1 1  Easily annoyed or irritable 0 0 1 1  Afraid - awful might happen 0 0 0 1  Total GAD 7 Score 0 0 6 7  Anxiety Difficulty Not difficult at all Not difficult at all  Not difficult at all     GENERAL: alert, oriented, appears well and in no acute distress  HEENT: atraumatic, conjunttiva clear, no obvious abnormalities on inspection of external nose and ears  NECK: normal movements of the head and neck  LUNGS: on inspection no signs of respiratory distress, breathing rate appears normal, no obvious gross SOB, gasping or wheezing  CV: no obvious cyanosis  MS: moves all visible extremities without noticeable abnormality  PSYCH/NEURO: pleasant and cooperative, no obvious depression or anxiety, speech and thought processing grossly intact  ASSESSMENT AND PLAN: Chronic midline low back pain without sciatica Assessment & Plan: Chronic, stable.  Continue gabapentin  900 mg nightly   Allergic rhinitis, unspecified seasonality, unspecified trigger -     Levocetirizine Dihydrochloride ; TAKE (1) TABLET BY MOUTH EVERY DAY  Dispense: 90 tablet; Refill: 3  Insomnia, unspecified type Assessment & Plan:  Chronic, stable.  Continue Lunesta  2 mg nightly  Orders: -     Eszopiclone ; Take 1 tablet (2 mg total) by mouth at bedtime. Take immediately before bedtime  Dispense: 30 tablet; Refill: 2  GAD (generalized anxiety disorder) Assessment & Plan: Chronic, stable.  Continue  wellbutrin  300mg  qam, buspar  10mg  every day prn.   Orders: -     busPIRone  HCl; Take 1 tablet (10 mg total) by mouth daily as needed. TAKE (1) TABLET BY MOUTH DAILY AT BEDTIME  Dispense: 30 tablet; Refill: 1     -we discussed possible serious and likely etiologies, options for evaluation and workup, limitations of telemedicine visit vs in person visit, treatment, treatment risks and precautions. Pt prefers to treat via telemedicine  empirically rather then risking or undertaking an in person visit at this moment.    I discussed the assessment and treatment plan with the patient. The patient was provided an opportunity to ask questions and all were answered. The patient agreed with the plan and demonstrated an understanding of the instructions.   The patient was advised to call back or seek an in-person evaluation if the symptoms worsen or if the condition fails to improve as anticipated.  Advised if desired AVS can be mailed or viewed via MyChart if Mychart user.   Rollene Northern, FNP

## 2024-06-21 NOTE — Assessment & Plan Note (Signed)
 Chronic, stable.  Continue Lunesta  2 mg nightly

## 2024-06-21 NOTE — Patient Instructions (Signed)
Nice seeing you today!

## 2024-06-21 NOTE — Assessment & Plan Note (Signed)
 Chronic, stable.  Continue  wellbutrin 300mg  qam, buspar 10mg  every day prn.

## 2024-06-21 NOTE — Assessment & Plan Note (Signed)
 Chronic, stable.  Continue gabapentin  900 mg nightly

## 2024-07-17 ENCOUNTER — Other Ambulatory Visit: Payer: Self-pay | Admitting: Obstetrics and Gynecology

## 2024-07-17 DIAGNOSIS — Z1231 Encounter for screening mammogram for malignant neoplasm of breast: Secondary | ICD-10-CM

## 2024-08-18 NOTE — Telephone Encounter (Signed)
 open in error

## 2024-08-26 ENCOUNTER — Other Ambulatory Visit: Payer: Self-pay | Admitting: Family

## 2024-08-26 DIAGNOSIS — F411 Generalized anxiety disorder: Secondary | ICD-10-CM

## 2024-08-31 ENCOUNTER — Ambulatory Visit
Admission: RE | Admit: 2024-08-31 | Discharge: 2024-08-31 | Disposition: A | Source: Ambulatory Visit | Attending: Obstetrics and Gynecology | Admitting: Obstetrics and Gynecology

## 2024-08-31 DIAGNOSIS — Z1231 Encounter for screening mammogram for malignant neoplasm of breast: Secondary | ICD-10-CM | POA: Diagnosis present
